# Patient Record
Sex: Female | Born: 1950 | Race: White | Hispanic: No | Smoking: Current some day smoker
Health system: Southern US, Community
[De-identification: ages and names within clinical notes are randomized; demographics above are authoritative.]

## PROBLEM LIST (undated history)

## (undated) DIAGNOSIS — J309 Allergic rhinitis, unspecified: Secondary | ICD-10-CM

## (undated) DIAGNOSIS — K589 Irritable bowel syndrome without diarrhea: Secondary | ICD-10-CM

## (undated) DIAGNOSIS — I7301 Raynaud's syndrome with gangrene: Secondary | ICD-10-CM

## (undated) DIAGNOSIS — M545 Low back pain, unspecified: Secondary | ICD-10-CM

## (undated) DIAGNOSIS — G2581 Restless legs syndrome: Secondary | ICD-10-CM

## (undated) DIAGNOSIS — M546 Pain in thoracic spine: Secondary | ICD-10-CM

## (undated) DIAGNOSIS — G894 Chronic pain syndrome: Secondary | ICD-10-CM

## (undated) DIAGNOSIS — H269 Unspecified cataract: Secondary | ICD-10-CM

## (undated) DIAGNOSIS — M533 Sacrococcygeal disorders, not elsewhere classified: Secondary | ICD-10-CM

## (undated) DIAGNOSIS — G259 Extrapyramidal and movement disorder, unspecified: Secondary | ICD-10-CM

## (undated) DIAGNOSIS — N993 Prolapse of vaginal vault after hysterectomy: Secondary | ICD-10-CM

## (undated) DIAGNOSIS — M6240 Contracture of muscle, unspecified site: Secondary | ICD-10-CM

## (undated) DIAGNOSIS — N952 Postmenopausal atrophic vaginitis: Secondary | ICD-10-CM

## (undated) DIAGNOSIS — R32 Unspecified urinary incontinence: Secondary | ICD-10-CM

## (undated) DIAGNOSIS — E669 Obesity, unspecified: Secondary | ICD-10-CM

## (undated) DIAGNOSIS — M1990 Unspecified osteoarthritis, unspecified site: Secondary | ICD-10-CM

## (undated) DIAGNOSIS — G479 Sleep disorder, unspecified: Secondary | ICD-10-CM

## (undated) DIAGNOSIS — G4730 Sleep apnea, unspecified: Secondary | ICD-10-CM

## (undated) DIAGNOSIS — N8110 Cystocele, unspecified: Secondary | ICD-10-CM

## (undated) DIAGNOSIS — E7800 Pure hypercholesterolemia, unspecified: Secondary | ICD-10-CM

## (undated) DIAGNOSIS — J45909 Unspecified asthma, uncomplicated: Secondary | ICD-10-CM

## (undated) DIAGNOSIS — K649 Unspecified hemorrhoids: Secondary | ICD-10-CM

## (undated) DIAGNOSIS — R251 Tremor, unspecified: Secondary | ICD-10-CM

## (undated) DIAGNOSIS — M19079 Primary osteoarthritis, unspecified ankle and foot: Secondary | ICD-10-CM

---

## 1998-11-22 ENCOUNTER — Encounter: Admission: RE | Admit: 1998-11-22 | Discharge: 1998-11-22 | Payer: Self-pay | Admitting: Infectious Diseases

## 2002-10-31 ENCOUNTER — Encounter: Admission: RE | Admit: 2002-10-31 | Discharge: 2002-10-31 | Payer: Self-pay | Admitting: Orthopedic Surgery

## 2002-10-31 ENCOUNTER — Encounter: Payer: Self-pay | Admitting: Orthopedic Surgery

## 2002-11-24 ENCOUNTER — Encounter: Admission: RE | Admit: 2002-11-24 | Discharge: 2002-11-24 | Payer: Self-pay | Admitting: Orthopedic Surgery

## 2002-11-24 ENCOUNTER — Encounter: Payer: Self-pay | Admitting: Orthopedic Surgery

## 2002-11-27 ENCOUNTER — Encounter: Payer: Self-pay | Admitting: Emergency Medicine

## 2002-11-27 ENCOUNTER — Emergency Department (HOSPITAL_COMMUNITY): Admission: EM | Admit: 2002-11-27 | Discharge: 2002-11-27 | Payer: Self-pay | Admitting: Emergency Medicine

## 2002-11-28 ENCOUNTER — Emergency Department (HOSPITAL_COMMUNITY): Admission: EM | Admit: 2002-11-28 | Discharge: 2002-11-29 | Payer: Self-pay | Admitting: Emergency Medicine

## 2003-12-18 DIAGNOSIS — J4540 Moderate persistent asthma, uncomplicated: Secondary | ICD-10-CM | POA: Diagnosis present

## 2004-04-04 ENCOUNTER — Ambulatory Visit (HOSPITAL_COMMUNITY): Admission: RE | Admit: 2004-04-04 | Discharge: 2004-04-04 | Payer: Self-pay | Admitting: Neurosurgery

## 2011-03-31 DIAGNOSIS — G4700 Insomnia, unspecified: Secondary | ICD-10-CM | POA: Insufficient documentation

## 2011-06-07 DIAGNOSIS — G250 Essential tremor: Secondary | ICD-10-CM | POA: Diagnosis present

## 2014-12-01 DIAGNOSIS — K582 Mixed irritable bowel syndrome: Secondary | ICD-10-CM | POA: Diagnosis present

## 2015-08-04 ENCOUNTER — Observation Stay
Admission: EM | Admit: 2015-08-04 | Discharge: 2015-08-06 | Disposition: A | Discharge status: Home / Self Care | Payer: Medicare Other | Attending: Internal Medicine | Admitting: Internal Medicine

## 2015-08-04 DIAGNOSIS — N952 Postmenopausal atrophic vaginitis: Secondary | ICD-10-CM | POA: Diagnosis not present

## 2015-08-04 DIAGNOSIS — M546 Pain in thoracic spine: Secondary | ICD-10-CM | POA: Insufficient documentation

## 2015-08-04 DIAGNOSIS — Z7982 Long term (current) use of aspirin: Secondary | ICD-10-CM | POA: Insufficient documentation

## 2015-08-04 DIAGNOSIS — I7300 Raynaud's syndrome without gangrene: Secondary | ICD-10-CM | POA: Diagnosis not present

## 2015-08-04 DIAGNOSIS — H269 Unspecified cataract: Secondary | ICD-10-CM | POA: Insufficient documentation

## 2015-08-04 DIAGNOSIS — E860 Dehydration: Secondary | ICD-10-CM | POA: Diagnosis not present

## 2015-08-04 DIAGNOSIS — R748 Abnormal levels of other serum enzymes: Secondary | ICD-10-CM | POA: Diagnosis not present

## 2015-08-04 DIAGNOSIS — M1990 Unspecified osteoarthritis, unspecified site: Secondary | ICD-10-CM | POA: Diagnosis not present

## 2015-08-04 DIAGNOSIS — Z882 Allergy status to sulfonamides status: Secondary | ICD-10-CM | POA: Insufficient documentation

## 2015-08-04 DIAGNOSIS — Z91013 Allergy to seafood: Secondary | ICD-10-CM | POA: Diagnosis not present

## 2015-08-04 DIAGNOSIS — Z880 Allergy status to penicillin: Secondary | ICD-10-CM | POA: Diagnosis not present

## 2015-08-04 DIAGNOSIS — F17200 Nicotine dependence, unspecified, uncomplicated: Secondary | ICD-10-CM | POA: Insufficient documentation

## 2015-08-04 DIAGNOSIS — E669 Obesity, unspecified: Secondary | ICD-10-CM | POA: Diagnosis not present

## 2015-08-04 DIAGNOSIS — F329 Major depressive disorder, single episode, unspecified: Secondary | ICD-10-CM | POA: Diagnosis not present

## 2015-08-04 DIAGNOSIS — R7989 Other specified abnormal findings of blood chemistry: Secondary | ICD-10-CM | POA: Insufficient documentation

## 2015-08-04 DIAGNOSIS — E785 Hyperlipidemia, unspecified: Secondary | ICD-10-CM | POA: Diagnosis not present

## 2015-08-04 DIAGNOSIS — K589 Irritable bowel syndrome without diarrhea: Secondary | ICD-10-CM | POA: Insufficient documentation

## 2015-08-04 DIAGNOSIS — Z7951 Long term (current) use of inhaled steroids: Secondary | ICD-10-CM | POA: Diagnosis not present

## 2015-08-04 DIAGNOSIS — M19079 Primary osteoarthritis, unspecified ankle and foot: Secondary | ICD-10-CM | POA: Diagnosis not present

## 2015-08-04 DIAGNOSIS — F419 Anxiety disorder, unspecified: Secondary | ICD-10-CM | POA: Diagnosis not present

## 2015-08-04 DIAGNOSIS — Z6831 Body mass index (BMI) 31.0-31.9, adult: Secondary | ICD-10-CM | POA: Insufficient documentation

## 2015-08-04 DIAGNOSIS — G8929 Other chronic pain: Secondary | ICD-10-CM | POA: Diagnosis not present

## 2015-08-04 DIAGNOSIS — R001 Bradycardia, unspecified: Secondary | ICD-10-CM | POA: Insufficient documentation

## 2015-08-04 DIAGNOSIS — G4730 Sleep apnea, unspecified: Secondary | ICD-10-CM | POA: Diagnosis not present

## 2015-08-04 DIAGNOSIS — Z91040 Latex allergy status: Secondary | ICD-10-CM | POA: Insufficient documentation

## 2015-08-04 DIAGNOSIS — Z885 Allergy status to narcotic agent status: Secondary | ICD-10-CM | POA: Insufficient documentation

## 2015-08-04 DIAGNOSIS — K219 Gastro-esophageal reflux disease without esophagitis: Secondary | ICD-10-CM | POA: Insufficient documentation

## 2015-08-04 DIAGNOSIS — G259 Extrapyramidal and movement disorder, unspecified: Secondary | ICD-10-CM | POA: Insufficient documentation

## 2015-08-04 DIAGNOSIS — R0789 Other chest pain: Secondary | ICD-10-CM | POA: Diagnosis not present

## 2015-08-04 DIAGNOSIS — K649 Unspecified hemorrhoids: Secondary | ICD-10-CM | POA: Diagnosis not present

## 2015-08-04 DIAGNOSIS — R9439 Abnormal result of other cardiovascular function study: Secondary | ICD-10-CM | POA: Diagnosis not present

## 2015-08-04 DIAGNOSIS — R109 Unspecified abdominal pain: Secondary | ICD-10-CM | POA: Insufficient documentation

## 2015-08-04 DIAGNOSIS — N8110 Cystocele, unspecified: Secondary | ICD-10-CM | POA: Diagnosis not present

## 2015-08-04 DIAGNOSIS — G2581 Restless legs syndrome: Secondary | ICD-10-CM | POA: Insufficient documentation

## 2015-08-04 DIAGNOSIS — R002 Palpitations: Secondary | ICD-10-CM | POA: Diagnosis not present

## 2015-08-04 DIAGNOSIS — Z79899 Other long term (current) drug therapy: Secondary | ICD-10-CM | POA: Insufficient documentation

## 2015-08-04 DIAGNOSIS — Z91030 Bee allergy status: Secondary | ICD-10-CM | POA: Insufficient documentation

## 2015-08-04 DIAGNOSIS — R251 Tremor, unspecified: Secondary | ICD-10-CM | POA: Insufficient documentation

## 2015-08-04 DIAGNOSIS — Z881 Allergy status to other antibiotic agents status: Secondary | ICD-10-CM | POA: Diagnosis not present

## 2015-08-04 DIAGNOSIS — M6240 Contracture of muscle, unspecified site: Secondary | ICD-10-CM | POA: Insufficient documentation

## 2015-08-04 DIAGNOSIS — G894 Chronic pain syndrome: Secondary | ICD-10-CM | POA: Diagnosis not present

## 2015-08-04 DIAGNOSIS — R079 Chest pain, unspecified: Secondary | ICD-10-CM

## 2015-08-04 DIAGNOSIS — E7800 Pure hypercholesterolemia, unspecified: Secondary | ICD-10-CM | POA: Insufficient documentation

## 2015-08-04 DIAGNOSIS — R112 Nausea with vomiting, unspecified: Secondary | ICD-10-CM | POA: Diagnosis not present

## 2015-08-04 DIAGNOSIS — J45909 Unspecified asthma, uncomplicated: Secondary | ICD-10-CM | POA: Insufficient documentation

## 2015-08-04 DIAGNOSIS — J449 Chronic obstructive pulmonary disease, unspecified: Secondary | ICD-10-CM | POA: Insufficient documentation

## 2015-08-04 DIAGNOSIS — Z8249 Family history of ischemic heart disease and other diseases of the circulatory system: Secondary | ICD-10-CM | POA: Diagnosis not present

## 2015-08-04 HISTORY — DX: Restless legs syndrome: G25.81

## 2015-08-04 HISTORY — DX: Unspecified osteoarthritis, unspecified site: M19.90

## 2015-08-04 HISTORY — DX: Contracture of muscle, unspecified site: M62.40

## 2015-08-04 HISTORY — DX: Obesity, unspecified: E66.9

## 2015-08-04 HISTORY — DX: Primary osteoarthritis, unspecified ankle and foot: M19.079

## 2015-08-04 HISTORY — DX: Pain in thoracic spine: M54.6

## 2015-08-04 HISTORY — DX: Unspecified asthma, uncomplicated: J45.909

## 2015-08-04 HISTORY — DX: Unspecified hemorrhoids: K64.9

## 2015-08-04 HISTORY — DX: Irritable bowel syndrome, unspecified: K58.9

## 2015-08-04 HISTORY — DX: Sleep apnea, unspecified: G47.30

## 2015-08-04 HISTORY — DX: Raynaud's syndrome with gangrene: I73.01

## 2015-08-04 HISTORY — DX: Sleep disorder, unspecified: G47.9

## 2015-08-04 HISTORY — DX: Prolapse of vaginal vault after hysterectomy: N99.3

## 2015-08-04 HISTORY — DX: Chronic pain syndrome: G89.4

## 2015-08-04 HISTORY — DX: Low back pain, unspecified: M54.50

## 2015-08-04 HISTORY — DX: Tremor, unspecified: R25.1

## 2015-08-04 HISTORY — DX: Extrapyramidal and movement disorder, unspecified: G25.9

## 2015-08-04 HISTORY — DX: Cystocele, unspecified: N81.10

## 2015-08-04 HISTORY — DX: Pure hypercholesterolemia, unspecified: E78.00

## 2015-08-04 HISTORY — DX: Sacrococcygeal disorders, not elsewhere classified: M53.3

## 2015-08-04 HISTORY — DX: Low back pain: M54.5

## 2015-08-04 HISTORY — DX: Unspecified urinary incontinence: R32

## 2015-08-04 HISTORY — DX: Postmenopausal atrophic vaginitis: N95.2

## 2015-08-04 HISTORY — DX: Unspecified cataract: H26.9

## 2015-08-04 HISTORY — DX: Allergic rhinitis, unspecified: J30.9

## 2015-08-04 NOTE — ED Notes (Signed)
Pt arrives via EMS from home with c/o Left Chest Pain described as being behind left breast and radiating over the shoulder to the back.  Pt with nausea.  Pain is sharp and 9/10.  Pt with these episodes for 2 months and is being followed by [facility] cardiology.  Pt with echo completed on Monday.

## 2015-08-05 ENCOUNTER — Emergency Department: Payer: Medicare Other

## 2015-08-05 ENCOUNTER — Observation Stay: Payer: Medicare Other

## 2015-08-05 ENCOUNTER — Encounter: Payer: Self-pay | Admitting: Emergency Medicine

## 2015-08-05 DIAGNOSIS — R079 Chest pain, unspecified: Secondary | ICD-10-CM | POA: Diagnosis present

## 2015-08-05 LAB — TROPONIN I
Troponin I: <0.03 ng/mL (ref <0.031)
Troponin I: <0.03 ng/mL (ref <0.031)

## 2015-08-05 LAB — CBC WITH DIFFERENTIAL/PLATELET
BASOS ABS: 0.1 10*3/uL (ref 0–0.1)
BASOS PCT: 1 %
EOS ABS: 0.1 10*3/uL (ref 0–0.7)
Eosinophils Relative: 1 %
HEMATOCRIT: 40.6 % (ref 35.0–47.0)
Hemoglobin: 13.5 g/dL (ref 12.0–16.0)
Lymphocytes Relative: 39 %
Lymphs Abs: 3.7 10*3/uL — ABNORMAL HIGH (ref 1.0–3.6)
MCH: 31.8 pg (ref 26.0–34.0)
MCHC: 33.3 g/dL (ref 32.0–36.0)
MCV: 95.5 fL (ref 80.0–100.0)
MONO ABS: 0.8 10*3/uL (ref 0.2–0.9)
MONOS PCT: 9 %
NEUTROS ABS: 4.7 10*3/uL (ref 1.4–6.5)
Neutrophils Relative %: 50 %
Platelets: 208 10*3/uL (ref 150–440)
RBC: 4.25 MIL/uL (ref 3.80–5.20)
RDW: 14 % (ref 11.5–14.5)
WBC: 9.3 10*3/uL (ref 3.6–11.0)

## 2015-08-05 LAB — COMPREHENSIVE METABOLIC PANEL
ALBUMIN: 4 g/dL (ref 3.5–5.0)
ALT: 59 U/L — ABNORMAL HIGH (ref 14–54)
ANION GAP: 8 (ref 5–15)
AST: 114 U/L — AB (ref 15–41)
Alkaline Phosphatase: 66 U/L (ref 38–126)
BUN: 22 mg/dL — AB (ref 6–20)
CHLORIDE: 99 mmol/L — AB (ref 101–111)
CO2: 30 mmol/L (ref 22–32)
Calcium: 9.1 mg/dL (ref 8.9–10.3)
Creatinine, Ser: 1.01 mg/dL — ABNORMAL HIGH (ref 0.44–1.00)
GFR calc Af Amer: >60 mL/min (ref >60)
GFR calc non Af Amer: 59 mL/min — ABNORMAL LOW (ref >60)
GLUCOSE: 99 mg/dL (ref 65–99)
POTASSIUM: 3.5 mmol/L (ref 3.5–5.1)
SODIUM: 137 mmol/L (ref 135–145)
TOTAL PROTEIN: 6.4 g/dL — AB (ref 6.5–8.1)
Total Bilirubin: 0.6 mg/dL (ref 0.3–1.2)

## 2015-08-05 LAB — NM MYOCAR MULTI W/SPECT W/WALL MOTION / EF
CHL CUP RESTING HR STRESS: 50 {beats}/min
CSEPPHR: 86 {beats}/min
LVDIAVOL: 140 mL
LVSYSVOL: 75 mL
NUC STRESS TID: 1.15
SDS: 1
SRS: 1
SSS: 0

## 2015-08-05 LAB — LIPASE, BLOOD: Lipase: 62 U/L — ABNORMAL HIGH (ref 22–51)

## 2015-08-05 LAB — BRAIN NATRIURETIC PEPTIDE: B NATRIURETIC PEPTIDE 5: 54 pg/mL (ref 0.0–100.0)

## 2015-08-05 MED ORDER — REGADENOSON 0.4 MG/5ML IV SOLN
0.4000 mg | Freq: Once | INTRAVENOUS | Status: AC
  Administered 2015-08-05: 0.4 mg via INTRAVENOUS
  Filled 2015-08-05: qty 5

## 2015-08-05 MED ORDER — TIOTROPIUM BROMIDE MONOHYDRATE 18 MCG IN CAPS
18.0000 ug | ORAL_CAPSULE | Freq: Every day | RESPIRATORY_TRACT | Status: DC
  Administered 2015-08-05 – 2015-08-06 (×2): 18 ug via RESPIRATORY_TRACT
  Filled 2015-08-05: qty 5

## 2015-08-05 MED ORDER — LORATADINE 10 MG PO TABS
10.0000 mg | ORAL_TABLET | Freq: Every day | ORAL | Status: DC
  Administered 2015-08-05: 10 mg via ORAL
  Filled 2015-08-05: qty 1

## 2015-08-05 MED ORDER — PNEUMOCOCCAL VAC POLYVALENT 25 MCG/0.5ML IJ INJ: 0.5000 mL | INJECTION | INTRAMUSCULAR | Status: DC

## 2015-08-05 MED ORDER — DARIFENACIN HYDROBROMIDE ER 7.5 MG PO TB24
7.5000 mg | ORAL_TABLET | Freq: Every day | ORAL | Status: DC
  Administered 2015-08-05: 7.5 mg via ORAL
  Filled 2015-08-05: qty 1

## 2015-08-05 MED ORDER — ROPINIROLE HCL 1 MG PO TABS
1.0000 mg | ORAL_TABLET | Freq: Two times a day (BID) | ORAL | Status: DC
Start: 2015-08-05 — End: 2015-08-06
  Administered 2015-08-05 (×2): 1 mg via ORAL
  Filled 2015-08-05 (×2): qty 1

## 2015-08-05 MED ORDER — ACETAMINOPHEN 650 MG RE SUPP: 650.0000 mg | Freq: Four times a day (QID) | RECTAL | Status: DC | PRN

## 2015-08-05 MED ORDER — SODIUM CHLORIDE 0.9 % WEIGHT BASED INFUSION
3.0000 mL/kg/h | INTRAVENOUS | Status: DC
  Administered 2015-08-06: 3 mL/kg/h via INTRAVENOUS

## 2015-08-05 MED ORDER — BACLOFEN 10 MG PO TABS
10.0000 mg | ORAL_TABLET | Freq: Two times a day (BID) | ORAL | Status: DC
  Administered 2015-08-05 (×2): 10 mg via ORAL
  Filled 2015-08-05 (×2): qty 1

## 2015-08-05 MED ORDER — FLUTICASONE PROPIONATE 50 MCG/ACT NA SUSP
1.0000 | Freq: Every day | NASAL | Status: DC
  Administered 2015-08-05: 1 via NASAL
  Filled 2015-08-05: qty 16

## 2015-08-05 MED ORDER — CLONAZEPAM 1 MG PO TABS: 1.0000 mg | ORAL_TABLET | Freq: Three times a day (TID) | ORAL | Status: DC | PRN

## 2015-08-05 MED ORDER — SODIUM CHLORIDE 0.9 % WEIGHT BASED INFUSION: 1.0000 mL/kg/h | INTRAVENOUS | Status: DC

## 2015-08-05 MED ORDER — OLOPATADINE HCL 0.1 % OP SOLN
1.0000 [drp] | Freq: Every day | OPHTHALMIC | Status: DC
  Administered 2015-08-05: 1 [drp] via OPHTHALMIC
  Filled 2015-08-05: qty 5

## 2015-08-05 MED ORDER — HYDROMORPHONE HCL 1 MG/ML IJ SOLN
0.5000 mg | Freq: Once | INTRAMUSCULAR | Status: AC
  Administered 2015-08-05: 0.5 mg via INTRAVENOUS
  Filled 2015-08-05: qty 1

## 2015-08-05 MED ORDER — ASPIRIN 81 MG PO TBEC: 81.0000 mg | DELAYED_RELEASE_TABLET | Freq: Every day | ORAL | Status: DC

## 2015-08-05 MED ORDER — BUSPIRONE HCL 10 MG PO TABS
30.0000 mg | ORAL_TABLET | Freq: Two times a day (BID) | ORAL | Status: DC
  Filled 2015-08-05 (×2): qty 3

## 2015-08-05 MED ORDER — SODIUM CHLORIDE 0.9 % IJ SOLN: 3.0000 mL | INTRAMUSCULAR | Status: DC | PRN

## 2015-08-05 MED ORDER — BUDESONIDE-FORMOTEROL FUMARATE 160-4.5 MCG/ACT IN AERO
2.0000 | INHALATION_SPRAY | Freq: Two times a day (BID) | RESPIRATORY_TRACT | Status: DC
  Administered 2015-08-05 – 2015-08-06 (×3): 2 via RESPIRATORY_TRACT
  Filled 2015-08-05: qty 6

## 2015-08-05 MED ORDER — HYDROCODONE-ACETAMINOPHEN 5-325 MG PO TABS
1.0000 | ORAL_TABLET | Freq: Three times a day (TID) | ORAL | Status: DC | PRN
  Administered 2015-08-05: 1 via ORAL
  Filled 2015-08-05: qty 1

## 2015-08-05 MED ORDER — LIDOCAINE 5 % EX OINT
1.0000 "application " | TOPICAL_OINTMENT | Freq: Two times a day (BID) | CUTANEOUS | Status: DC | PRN
  Administered 2015-08-05: 1 via TOPICAL
  Filled 2015-08-05: qty 35.44

## 2015-08-05 MED ORDER — ONDANSETRON HCL 4 MG PO TABS: 4.0000 mg | ORAL_TABLET | Freq: Four times a day (QID) | ORAL | Status: DC | PRN

## 2015-08-05 MED ORDER — ASPIRIN EC 81 MG PO TBEC
81.0000 mg | DELAYED_RELEASE_TABLET | Freq: Every day | ORAL | Status: DC
  Administered 2015-08-05: 81 mg via ORAL
  Filled 2015-08-05: qty 1

## 2015-08-05 MED ORDER — VITAMIN B-6 100 MG PO TABS
200.0000 mg | ORAL_TABLET | Freq: Every day | ORAL | Status: DC
  Administered 2015-08-05: 200 mg via ORAL
  Filled 2015-08-05 (×2): qty 2

## 2015-08-05 MED ORDER — TECHNETIUM TC 99M SESTAMIBI - CARDIOLITE
14.0650 | Freq: Once | INTRAVENOUS | Status: AC | PRN
  Administered 2015-08-05: 11:00:00 14.065 via INTRAVENOUS

## 2015-08-05 MED ORDER — ACETAMINOPHEN 325 MG PO TABS: 650.0000 mg | ORAL_TABLET | Freq: Four times a day (QID) | ORAL | Status: DC | PRN

## 2015-08-05 MED ORDER — CHOLECALCIFEROL 10 MCG (400 UNIT) PO TABS
400.0000 [IU] | ORAL_TABLET | Freq: Every day | ORAL | Status: DC
  Administered 2015-08-05: 400 [IU] via ORAL
  Filled 2015-08-05: qty 1

## 2015-08-05 MED ORDER — TEMAZEPAM 15 MG PO CAPS
30.0000 mg | ORAL_CAPSULE | Freq: Every day | ORAL | Status: DC
  Administered 2015-08-05: 30 mg via ORAL
  Filled 2015-08-05: qty 2

## 2015-08-05 MED ORDER — SODIUM CHLORIDE 0.9 % IV SOLN: 250.0000 mL | INTRAVENOUS | Status: DC | PRN

## 2015-08-05 MED ORDER — DICYCLOMINE HCL 20 MG PO TABS
20.0000 mg | ORAL_TABLET | Freq: Three times a day (TID) | ORAL | Status: DC
  Administered 2015-08-06: 20 mg via ORAL
  Filled 2015-08-05 (×5): qty 1

## 2015-08-05 MED ORDER — MINOCYCLINE HCL 50 MG PO CAPS
50.0000 mg | ORAL_CAPSULE | Freq: Every day | ORAL | Status: DC
  Administered 2015-08-05: 50 mg via ORAL
  Filled 2015-08-05 (×2): qty 1

## 2015-08-05 MED ORDER — ALBUTEROL SULFATE (2.5 MG/3ML) 0.083% IN NEBU: 3.0000 mL | INHALATION_SOLUTION | Freq: Four times a day (QID) | RESPIRATORY_TRACT | Status: DC | PRN

## 2015-08-05 MED ORDER — OCUVITE-LUTEIN PO CAPS
1.0000 | ORAL_CAPSULE | Freq: Every day | ORAL | Status: DC
  Administered 2015-08-05: 1 via ORAL
  Filled 2015-08-05: qty 1

## 2015-08-05 MED ORDER — REGADENOSON 0.4 MG/5ML IV SOLN
0.4000 mg | Freq: Once | INTRAVENOUS | Status: DC
  Filled 2015-08-05: qty 5

## 2015-08-05 MED ORDER — SODIUM CHLORIDE 0.9 % IJ SOLN: 3.0000 mL | Freq: Two times a day (BID) | INTRAMUSCULAR | Status: DC

## 2015-08-05 MED ORDER — NITROGLYCERIN 0.4 MG SL SUBL: 0.4000 mg | SUBLINGUAL_TABLET | SUBLINGUAL | Status: DC | PRN

## 2015-08-05 MED ORDER — TECHNETIUM TC 99M SESTAMIBI - CARDIOLITE
30.0600 | Freq: Once | INTRAVENOUS | Status: AC | PRN
  Administered 2015-08-05: 13:00:00 30.06 via INTRAVENOUS

## 2015-08-05 MED ORDER — ESTROGENS, CONJUGATED 0.625 MG/GM VA CREA
1.0000 | TOPICAL_CREAM | VAGINAL | Status: DC
  Filled 2015-08-05: qty 30

## 2015-08-05 MED ORDER — PANTOPRAZOLE SODIUM 40 MG PO TBEC
40.0000 mg | DELAYED_RELEASE_TABLET | Freq: Every day | ORAL | Status: DC
  Administered 2015-08-05: 40 mg via ORAL
  Filled 2015-08-05: qty 1

## 2015-08-05 MED ORDER — ENOXAPARIN SODIUM 40 MG/0.4ML ~~LOC~~ SOLN
40.0000 mg | SUBCUTANEOUS | Status: DC
  Administered 2015-08-05: 40 mg via SUBCUTANEOUS
  Filled 2015-08-05: qty 0.4

## 2015-08-05 MED ORDER — ATORVASTATIN CALCIUM 20 MG PO TABS
40.0000 mg | ORAL_TABLET | Freq: Every evening | ORAL | Status: DC
  Administered 2015-08-05: 40 mg via ORAL
  Filled 2015-08-05: qty 2

## 2015-08-05 MED ORDER — ONDANSETRON HCL 4 MG/2ML IJ SOLN
4.0000 mg | Freq: Once | INTRAMUSCULAR | Status: AC
  Administered 2015-08-05: 4 mg via INTRAVENOUS
  Filled 2015-08-05: qty 2

## 2015-08-05 MED ORDER — SULINDAC 200 MG PO TABS
200.0000 mg | ORAL_TABLET | Freq: Every day | ORAL | Status: DC
  Filled 2015-08-05 (×3): qty 1

## 2015-08-05 MED ORDER — FLUOXETINE HCL 20 MG PO CAPS
60.0000 mg | ORAL_CAPSULE | Freq: Every day | ORAL | Status: DC
  Filled 2015-08-05: qty 3

## 2015-08-05 MED ORDER — MONTELUKAST SODIUM 10 MG PO TABS
10.0000 mg | ORAL_TABLET | Freq: Every day | ORAL | Status: DC
  Administered 2015-08-05: 10 mg via ORAL
  Filled 2015-08-05: qty 1

## 2015-08-05 MED ORDER — ONDANSETRON HCL 4 MG/2ML IJ SOLN: 4.0000 mg | Freq: Four times a day (QID) | INTRAMUSCULAR | Status: DC | PRN

## 2015-08-05 MED ORDER — CYCLOSPORINE 0.05 % OP EMUL
1.0000 [drp] | Freq: Two times a day (BID) | OPHTHALMIC | Status: DC
  Administered 2015-08-05 (×2): 1 [drp] via OPHTHALMIC
  Filled 2015-08-05 (×4): qty 1

## 2015-08-05 MED ORDER — SODIUM CHLORIDE 0.9 % IV BOLUS (SEPSIS)
1000.0000 mL | Freq: Once | INTRAVENOUS | Status: AC
  Administered 2015-08-05: 1000 mL via INTRAVENOUS

## 2015-08-05 MED ORDER — TECHNETIUM TC 99M SESTAMIBI - CARDIOLITE: 30.0000 | Freq: Once | INTRAVENOUS | Status: DC | PRN

## 2015-08-05 MED ORDER — ASPIRIN 81 MG PO CHEW
81.0000 mg | CHEWABLE_TABLET | ORAL | Status: AC
  Administered 2015-08-06: 81 mg via ORAL
  Filled 2015-08-05: qty 1

## 2015-08-05 MED ORDER — INFLUENZA VAC SPLIT QUAD 0.5 ML IM SUSY: 0.5000 mL | PREFILLED_SYRINGE | INTRAMUSCULAR | Status: DC

## 2015-08-05 MED ORDER — ALOSETRON HCL 1 MG PO TABS: 1.0000 mg | ORAL_TABLET | Freq: Two times a day (BID) | ORAL | Status: DC

## 2015-08-05 MED ORDER — RAMELTEON 8 MG PO TABS
8.0000 mg | ORAL_TABLET | Freq: Every day | ORAL | Status: DC
  Administered 2015-08-05: 8 mg via ORAL
  Filled 2015-08-05 (×2): qty 1

## 2015-08-05 MED ORDER — TECHNETIUM TC 99M MEBROFENIN IV KIT: 5.0000 | PACK | Freq: Once | INTRAVENOUS | Status: DC | PRN

## 2015-08-05 NOTE — Discharge Summary (Signed)
[place] at [place] NAME: Sonia Robinson    MR#:  [id number]  DATE OF BIRTH:  1951-08-16  DATE OF ADMISSION:  08/04/2015 ADMITTING PHYSICIAN: Fritzi Mandes, MD  DATE OF DISCHARGE: 08/05/2015 PRIMARY CARE PHYSICIAN: PROVIDER NOT IN SYSTEM    ADMISSION DIAGNOSIS:  Chest pain [R07.9] Chest pain, unspecified chest pain type [R07.9]   DISCHARGE DIAGNOSIS:   Atypical chest pain. SECONDARY DIAGNOSIS:   Past Medical History  Diagnosis Date  . Spasmodic colon   . Nasal inflammation due to allergen   . Asthma   . Cataract   . Hemorrhoids   . High cholesterol   . Low back pain   . Sleep disorder   . Osteoarthritis   . Prolapse of vaginal vault after hysterectomy   . Restless leg syndrome   . Sleep apnea   . Loss of bladder control   . Disorder of sacrum   . Movement disorder   . Tremor   . Obesity   . Chronic pain syndrome   . Degenerative joint disease of ankle and foot   . Pain in thoracic spine   . Involuntary muscle contractions   . Irritable bowel syndrome   . 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 Fallen bladder   . Vaginal atrophy     HOSPITAL COURSE:   1. Chest pain on and off several months. Patient reports pain under the left breast bone. It is reproducible on palpation. Likely musculoskeletal. EKG does not show any ST-T changes. Cardiac enzyme negative. She is on aspirin and when necessary nitroglycerin. She will be discharged if stress test is normal.  2. Hyperlipidemia continue lipitor.  3. Bradycardia as noted on EKG. Patient asymptomatic   * Tobacco abuse. She restarted smoking 3 months ago, 1 pack a day. Smoking cessation was counseled for 3-5 min.  DISCHARGE CONDITIONS:   Stable, discharge to home today.  CONSULTS OBTAINED:     DRUG ALLERGIES:   Allergies  Allergen Reactions  . Bee Venom Anaphylaxis  . Betadine [Povidone Iodine] Shortness Of Breath  . Morphine And Related Anaphylaxis   . Codeine Swelling  . Depakote [Divalproex Sodium] Hives  . Naproxen Swelling  . Augmentin [Amoxicillin-Pot Clavulanate] Rash  . Latex Rash  . Penicillins Rash  . Shellfish Allergy Nausea And Vomiting, Swelling and Rash  . Sulfa Antibiotics Itching    DISCHARGE MEDICATIONS:   Current Discharge Medication List    START taking these medications   Details  aspirin EC 81 MG EC tablet Take 1 tablet (81 mg total) by mouth daily. Qty: 30 tablet, Refills: 0      CONTINUE these medications which have NOT CHANGED   Details  albuterol (PROVENTIL HFA;VENTOLIN HFA) 108 (90 BASE) MCG/ACT inhaler Inhale 2 puffs into the lungs every 6 (six) hours as needed for wheezing or shortness of breath.    alosetron (LOTRONEX) 1 MG tablet Take 1 mg by mouth 2 (two) times daily.    atorvastatin (LIPITOR) 40 MG tablet Take 40 mg by mouth every evening.    budesonide-formoterol (SYMBICORT) 160-4.5 MCG/ACT inhaler Inhale 2 puffs into the lungs 2 (two) times daily.    busPIRone (BUSPAR) 15 MG tablet Take 30 mg by mouth 2 (two) times daily.    cetirizine (ZYRTEC) 10 MG tablet Take 10 mg by mouth daily.    cholecalciferol (VITAMIN D) 400 UNITS TABS tablet Take 400 Units by mouth daily.    clonazePAM (KLONOPIN) 1 MG tablet  Take 1 mg by mouth 3 (three) times daily as needed for anxiety.    conjugated estrogens (PREMARIN) vaginal cream Place 1 Applicatorful vaginally 2 (two) times a week.    cycloSPORINE (RESTASIS) 0.05 % ophthalmic emulsion Place 1 drop into both eyes 2 (two) times daily.    diclofenac sodium (VOLTAREN) 1 % GEL Apply 2 g topically 4 (four) times daily.    dicyclomine (BENTYL) 20 MG tablet Take 20 mg by mouth every 6 (six) hours.    diphenoxylate-atropine (LOMOTIL) 2.5-0.025 MG tablet Take 1 tablet by mouth 3 (three) times daily as needed for diarrhea or loose stools.    EPINEPHrine (EPIPEN 2-PAK) 0.3 mg/0.3 mL IJ SOAJ injection Inject 0.3 mg into the muscle once.    esomeprazole  (NEXIUM) 40 MG capsule Take 40 mg by mouth daily before breakfast.    FLUoxetine (PROZAC) 20 MG capsule Take 60 mg by mouth daily.    fluticasone (FLONASE) 50 MCG/ACT nasal spray Place 1 spray into both nostrils daily.    HYDROcodone-acetaminophen (NORCO/VICODIN) 5-325 MG tablet Take 1 tablet by mouth every 8 (eight) hours as needed for moderate pain.    hydrocortisone (PROCTOSOL HC) 2.5 % rectal cream Place 1 application rectally daily as needed for hemorrhoids or itching.    lidocaine (XYLOCAINE) 5 % ointment Apply 1 application topically 2 (two) times daily as needed for mild pain.    minocycline (MINOCIN,DYNACIN) 50 MG capsule Take 50 mg by mouth daily.    montelukast (SINGULAIR) 10 MG tablet Take 10 mg by mouth at bedtime.    multivitamin-lutein (OCUVITE-LUTEIN) CAPS capsule Take 1 capsule by mouth daily.    olopatadine (PATANOL) 0.1 % ophthalmic solution Place 1 drop into both eyes daily.    pyridOXINE (VITAMIN B-6) 100 MG tablet Take 200 mg by mouth daily.    ramelteon (ROZEREM) 8 MG tablet Take 8 mg by mouth at bedtime.    rOPINIRole (REQUIP) 1 MG tablet Take 1 mg by mouth 2 (two) times daily. One tablet at 1800 and one tablet at bedtime    solifenacin (VESICARE) 5 MG tablet Take 5 mg by mouth daily.    sulindac (CLINORIL) 200 MG tablet Take 200 mg by mouth daily.    temazepam (RESTORIL) 30 MG capsule Take 30 mg by mouth at bedtime.    tiotropium (SPIRIVA) 18 MCG inhalation capsule Place 18 mcg into inhaler and inhale daily.    urea (CARMOL) 40 % ointment Apply 1 application topically every evening.      STOP taking these medications     baclofen (LIORESAL) 10 MG tablet          DISCHARGE INSTRUCTIONS:    If you experience worsening of your admission symptoms, develop shortness of breath, life threatening emergency, suicidal or homicidal thoughts you must seek medical attention immediately by calling 911 or calling your MD immediately  if symptoms less  severe.  You Must read complete instructions/literature along with all the possible adverse reactions/side effects for all the Medicines you take and that have been prescribed to you. Take any new Medicines after you have completely understood and accept all the possible adverse reactions/side effects.   Please note  You were cared for by a hospitalist during your hospital stay. If you have any questions about your discharge medications or the care you received while you were in the hospital after you are discharged, you can call the unit and asked to speak with the hospitalist on call if the hospitalist that took care  of you is not available. Once you are discharged, your primary care physician will handle any further medical issues. Please note that NO REFILLS for any discharge medications will be authorized once you are discharged, as it is imperative that you return to your primary care physician (or establish a relationship with a primary care physician if you do not have one) for your aftercare needs so that they can reassess your need for medications and monitor your lab values.    Today   SUBJECTIVE   Left side chest pain under breast.   VITAL SIGNS:  Blood pressure 118/61, pulse 54, temperature 98 F (36.7 C), temperature source Oral, resp. rate 18, height 5\' 7"  (1.702 m), weight 87.998 kg (194 lb), SpO2 100 %.  I/O:   Intake/Output Summary (Last 24 hours) at 08/05/15 1618 Last data filed at 08/05/15 1413  Gross per 24 hour  Intake      0 ml  Output      0 ml  Net      0 ml    PHYSICAL EXAMINATION:  GENERAL:  64 y.o.-year-old patient lying in the bed with no acute distress.  EYES: Pupils equal, round, reactive to light and accommodation. No scleral icterus. Extraocular muscles intact.  HEENT: Head atraumatic, normocephalic. Oropharynx and nasopharynx clear.  NECK:  Supple, no jugular venous distention. No thyroid enlargement, no tenderness.  LUNGS: Normal breath sounds  bilaterally, no wheezing, rales,rhonchi or crepitation. No use of accessory muscles of respiration.  Tenderness on palpation on ribs under left breast. CARDIOVASCULAR: S1, S2 normal. No murmurs, rubs, or gallops.  ABDOMEN: Soft, non-tender, non-distended. Bowel sounds present. No organomegaly or mass.  EXTREMITIES: No pedal edema, cyanosis, or clubbing.  NEUROLOGIC: Cranial nerves II through XII are intact. Muscle strength 5/5 in all extremities. Sensation intact. Gait not checked.  PSYCHIATRIC: The patient is alert and oriented x 3.  SKIN: No obvious rash, lesion, or ulcer.   DATA REVIEW:   CBC  Recent Labs Lab 08/04/15 2336  WBC 9.3  HGB 13.5  HCT 40.6  PLT 208    Chemistries   Recent Labs Lab 08/04/15 2336  NA 137  K 3.5  CL 99*  CO2 30  GLUCOSE 99  BUN 22*  CREATININE 1.01*  CALCIUM 9.1  AST 114*  ALT 59*  ALKPHOS 66  BILITOT 0.6    Cardiac Enzymes  Recent Labs Lab 08/05/15 [place] <0.03    Microbiology Results  No results found for this or any previous visit.  RADIOLOGY:  Dg Chest 2 View  08/05/2015  CLINICAL DATA:  64 year old female with left chest pain radiating to the shoulder and back with nausea. Initial encounter. EXAM: CHEST  2 VIEW COMPARISON:  None. FINDINGS: Normal lung volumes. Normal cardiac size. Other mediastinal contours are within normal limits. Visualized tracheal air column is within normal limits. No pneumothorax, pulmonary edema, pleural effusion or confluent pulmonary opacity. Cholecystectomy clips. No acute osseous abnormality identified. IMPRESSION: Negative, no acute cardiopulmonary abnormality. Electronically Signed   By: Genevie Ann M.D.   On: 08/05/2015 00:49   US Abdomen Limited Ruq  08/05/2015  CLINICAL DATA:  Elevated liver function studies and lipase. Post cholecystectomy. Abdominal pain with nausea and vomiting for 2 months. EXAM: US ABDOMEN LIMITED - RIGHT UPPER QUADRANT COMPARISON:  None. FINDINGS: Gallbladder:  Gallbladder is surgically absent. Common bile duct: Diameter: 6.6 mm, normal Liver: No focal lesion identified. Within normal limits in parenchymal echogenicity. IMPRESSION: Surgical absence of the gallbladder. No  bile duct dilatation. Normal ultrasound appearance of the liver. Electronically Signed   By: Lucienne Capers M.D.   On: 08/05/2015 02:28        Management plans discussed with the patient, family and they are in agreement.  CODE STATUS:     Code Status Orders        Start     Ordered   08/05/15 0415  Full code   Continuous     08/05/15 0414    Advance Directive Documentation        Most Recent Value   Type of Advance Directive  Healthcare Power of Attorney, Living will   Pre-existing out of facility DNR order (yellow form or pink MOST form)     "MOST" Form in Place?        TOTAL TIME TAKING CARE OF THIS PATIENT: 36 minutes.    Demetrios Loll M.D on 08/05/2015 at 4:18 PM  Between 7am to 6pm - Pager - [phone]  After 6pm go to [email or web address] - password [email or web address] [facility]  Office  [phone]  CC: Primary care physician; PROVIDER NOT IN SYSTEM

## 2015-08-05 NOTE — Discharge Instructions (Signed)
Heart healthy diet. °Activity as tolerated. °Smoking cessation. °

## 2015-08-05 NOTE — Progress Notes (Signed)
Sonia Robinson at [place] NAME: Sonia Robinson    MR#:  [id number]  DATE OF BIRTH:  1951-08-19  SUBJECTIVE:  CHIEF COMPLAINT:   Chief Complaint  Patient presents with  . Chest Pain  still left side chest pain.  REVIEW OF SYSTEMS:  CONSTITUTIONAL: No fever, fatigue or weakness.  EYES: No blurred or double vision.  EARS, NOSE, AND THROAT: No tinnitus or ear pain.  RESPIRATORY: No cough, shortness of breath, wheezing or hemoptysis.  CARDIOVASCULAR: Has chest pain, no orthopnea, edema.  GASTROINTESTINAL: No nausea, vomiting, diarrhea or abdominal pain.  GENITOURINARY: No dysuria, hematuria.  ENDOCRINE: No polyuria, nocturia,  HEMATOLOGY: No anemia, easy bruising or bleeding SKIN: No rash or lesion. MUSCULOSKELETAL: No joint pain or arthritis.   NEUROLOGIC: No tingling, numbness, weakness.  PSYCHIATRY: No anxiety or depression.   DRUG ALLERGIES:   Allergies  Allergen Reactions  . Bee Venom Anaphylaxis  . Betadine [Povidone Iodine] Shortness Of Breath  . Morphine And Related Anaphylaxis  . Codeine Swelling  . Depakote [Divalproex Sodium] Hives  . Naproxen Swelling  . Augmentin [Amoxicillin-Pot Clavulanate] Rash  . Latex Rash  . Penicillins Rash  . Shellfish Allergy Nausea And Vomiting, Swelling and Rash  . Sulfa Antibiotics Itching    VITALS:  Blood pressure 118/61, pulse 54, temperature 98 F (36.7 C), temperature source Oral, resp. rate 18, height 5\' 7"  (1.702 m), weight 87.998 kg (194 lb), SpO2 100 %.  PHYSICAL EXAMINATION:  GENERAL:  64 y.o.-year-old patient lying in the bed with no acute distress.  EYES: Pupils equal, round, reactive to light and accommodation. No scleral icterus. Extraocular muscles intact.  HEENT: Head atraumatic, normocephalic. Oropharynx and nasopharynx clear.  NECK:  Supple, no jugular venous distention. No thyroid enlargement, no tenderness.  LUNGS: Normal breath sounds bilaterally, no wheezing,  rales,rhonchi or crepitation. No use of accessory muscles of respiration. Tenderness on left side chest wall under breast. CARDIOVASCULAR: S1, S2 normal. No murmurs, rubs, or gallops.  ABDOMEN: Soft, nontender, nondistended. Bowel sounds present. No organomegaly or mass.  EXTREMITIES: No pedal edema, cyanosis, or clubbing.  NEUROLOGIC: Cranial nerves II through XII are intact. Muscle strength 5/5 in all extremities. Sensation intact. Gait not checked.  PSYCHIATRIC: The patient is alert and oriented x 3.  SKIN: No obvious rash, lesion, or ulcer.    LABORATORY PANEL:   CBC  Recent Labs Lab 08/04/15 2336  WBC 9.3  HGB 13.5  HCT 40.6  PLT 208   ------------------------------------------------------------------------------------------------------------------  Chemistries   Recent Labs Lab 08/04/15 2336  NA 137  K 3.5  CL 99*  CO2 30  GLUCOSE 99  BUN 22*  CREATININE 1.01*  CALCIUM 9.1  AST 114*  ALT 59*  ALKPHOS 66  BILITOT 0.6   ------------------------------------------------------------------------------------------------------------------  Cardiac Enzymes  Recent Labs Lab 08/05/15 1428  TROPONINI <0.03   ------------------------------------------------------------------------------------------------------------------  RADIOLOGY:  Dg Chest 2 View  08/05/2015  CLINICAL DATA:  64 year old female with left chest pain radiating to the shoulder and back with nausea. Initial encounter. EXAM: CHEST  2 VIEW COMPARISON:  None. FINDINGS: Normal lung volumes. Normal cardiac size. Other mediastinal contours are within normal limits. Visualized tracheal air column is within normal limits. No pneumothorax, pulmonary edema, pleural effusion or confluent pulmonary opacity. Cholecystectomy clips. No acute osseous abnormality identified. IMPRESSION: Negative, no acute cardiopulmonary abnormality. Electronically Signed   By: Sonia Robinson M.D.   On: 08/05/2015 00:49   Nm Myocar Multi  W/spect W/wall Motion /  Ef  08/05/2015   There was no ST segment deviation noted during stress.  Blood pressure demonstrated a blunted response to exercise.  No T wave inversion was noted during stress.  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  The left ventricular ejection fraction is moderately decreased (30-44%).  Nuclear stress EF: 33%.    US Abdomen Limited Ruq  08/05/2015  CLINICAL DATA:  Elevated liver function studies and lipase. Post cholecystectomy. Abdominal pain with nausea and vomiting for 2 months. EXAM: US ABDOMEN LIMITED - RIGHT UPPER QUADRANT COMPARISON:  None. FINDINGS: Gallbladder: Gallbladder is surgically absent. Common bile duct: Diameter: 6.6 mm, normal Liver: No focal lesion identified. Within normal limits in parenchymal echogenicity. IMPRESSION: Surgical absence of the gallbladder. No bile duct dilatation. Normal ultrasound appearance of the liver. Electronically Signed   By: Lucienne Capers M.D.   On: 08/05/2015 02:28    EKG:   Orders placed or performed during the hospital encounter of 08/04/15  . EKG 12-Lead  . EKG 12-Lead    ASSESSMENT AND PLAN:   1. Chest pain on and off several months. Patient reports pain under the left breast bone. It is reproducible on palpation. Likely musculoskeletal. EKG does not show any ST-T changes. Cardiac enzyme negative. She is on aspirin and when necessary nitroglycerin. Per Dr. Clayborn Bigness, stress test is abnormal. The left ventricular ejection fraction is moderately decreased (30-44%). If patient still has chest pain, will get cardiac cath tomorrow. I discussed with Dr. Clayborn Bigness, patient and her daughter. The patient still has chest pain, she wants to get cath tomorrow.   2. Hyperlipidemia continue lipitor.  3. Bradycardia as noted on EKG. Patient asymptomatic   * Tobacco abuse. She restarted smoking 3 months ago, 1 pack a day. Smoking cessation was counseled for 3-5 min.  * Abnormal LFT. F/u  CMP. * Mild dehydration.   I discussed with Dr. Clayborn Bigness. All the records are reviewed and case discussed with Care Management/Social Workerr. Management plans discussed with the patient, her daughter and they are in agreement.  CODE STATUS: Full code.  TOTAL TIME TAKING CARE OF THIS PATIENT: 56 minutes.   POSSIBLE D/C IN 1-2 DAYS, DEPENDING ON CLINICAL CONDITION.   Demetrios Loll M.D on 08/05/2015 at 5:19 PM  Between 7am to 6pm - Pager - [phone]  After 6pm go to [email or web address] - password [email or web address]  Office  [phone]  CC: Primary care physician; PROVIDER NOT IN SYSTEM

## 2015-08-05 NOTE — H&P (Signed)
[facility] [place] NAME: Sonia Robinson    MR#:  [id number]  DATE OF BIRTH:  [birth year]  DATE OF ADMISSION:  08/04/2015  PRIMARY CARE PHYSICIAN: [facility]  REQUESTING/REFERRING PHYSICIAN: dr sung  CHIEF COMPLAINT:  Left-sided chest pain for last couple months  HISTORY OF PRESENT ILLNESS:  Sonia Robinson  is a 64 y.o. female with a known history of hyperlipidemia osteoarthritis, sleep apnea, DJD, comes to the emergency room with on and off complaints of left-sided chest pain which she reports radiates to her left side of the neck. Patient was seen at [facility] cardiology had done an echocardiogram and was told it looks okay. No further evaluation was recommended. She has had a normal stress test about 10 years ago.  Patient came to the emergency room after she had significant chest pain which patient reports under the breast bone. First set of cardiac enzyme is negative. Ultrasound of the abdomen is negative as well. Patient has reproducible chest pain on palpation during my evaluation.  PAST MEDICAL HISTORY:   Past Medical History  Diagnosis Date  . Spasmodic colon   . Nasal inflammation due to allergen   . Asthma   . Cataract   . Hemorrhoids   . High cholesterol   . Low back pain   . Sleep disorder   . Osteoarthritis   . Prolapse of vaginal vault after hysterectomy   . Restless leg syndrome   . Sleep apnea   . Loss of bladder control   . Disorder of sacrum   . Movement disorder   . Tremor   . Obesity   . Chronic pain syndrome   . Degenerative joint disease of ankle and foot   . Pain in thoracic spine   . Involuntary muscle contractions   . Irritable bowel syndrome   . 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 Fallen bladder   . Vaginal atrophy     PAST SURGICAL HISTOIRY:  No past surgical history on file.  SOCIAL HISTORY:   Social History  Substance Use Topics  . Smoking status: Not on file  .  Smokeless tobacco: Not on file  . Alcohol Use: Not on file    FAMILY HISTORY:  No family history on file.  DRUG ALLERGIES:   Allergies  Allergen Reactions  . Bee Venom Anaphylaxis  . Betadine [Povidone Iodine] Shortness Of Breath  . Morphine And Related Anaphylaxis  . Codeine Swelling  . Depakote [Divalproex Sodium] Hives  . Naproxen Swelling  . Augmentin [Amoxicillin-Pot Clavulanate] Rash  . Latex Rash  . Penicillins Rash  . Shellfish Allergy Nausea And Vomiting, Swelling and Rash  . Sulfa Antibiotics Itching    REVIEW OF SYSTEMS:  Review of Systems  Constitutional: Negative for fever, chills and weight loss.  HENT: Negative for ear discharge, ear pain and nosebleeds.   Eyes: Negative for blurred vision, pain and discharge.  Respiratory: Negative for sputum production, shortness of breath, wheezing and stridor.   Cardiovascular: Positive for chest pain. Negative for palpitations, orthopnea and PND.  Gastrointestinal: Negative for nausea, vomiting, abdominal pain and diarrhea.  Genitourinary: Negative for urgency and frequency.  Musculoskeletal: Positive for back pain and joint pain.  Neurological: Negative for sensory change, speech change, focal weakness and weakness.  Psychiatric/Behavioral: Negative for depression and hallucinations. The patient is not nervous/anxious.   All other systems reviewed and are negative.    MEDICATIONS AT HOME:   Prior to Admission  medications   Medication Sig Start Date End Date Taking? Authorizing Provider  albuterol (PROVENTIL HFA;VENTOLIN HFA) 108 (90 BASE) MCG/ACT inhaler Inhale 2 puffs into the lungs every 6 (six) hours as needed for wheezing or shortness of breath.   Yes Historical Provider, MD  alosetron (LOTRONEX) 1 MG tablet Take 1 mg by mouth 2 (two) times daily.   Yes Historical Provider, MD  atorvastatin (LIPITOR) 40 MG tablet Take 40 mg by mouth every evening.   Yes Historical Provider, MD  baclofen (LIORESAL) 10 MG tablet  Take 10 mg by mouth 2 (two) times daily.   Yes Historical Provider, MD  budesonide-formoterol (SYMBICORT) 160-4.5 MCG/ACT inhaler Inhale 2 puffs into the lungs 2 (two) times daily.   Yes Historical Provider, MD  busPIRone (BUSPAR) 15 MG tablet Take 30 mg by mouth 2 (two) times daily.   Yes Historical Provider, MD  cetirizine (ZYRTEC) 10 MG tablet Take 10 mg by mouth daily.   Yes Historical Provider, MD  cholecalciferol (VITAMIN D) 400 UNITS TABS tablet Take 400 Units by mouth daily.   Yes Historical Provider, MD  clonazePAM (KLONOPIN) 1 MG tablet Take 1 mg by mouth 3 (three) times daily as needed for anxiety.   Yes Historical Provider, MD  conjugated estrogens (PREMARIN) vaginal cream Place 1 Applicatorful vaginally 2 (two) times a week.   Yes Historical Provider, MD  cycloSPORINE (RESTASIS) 0.05 % ophthalmic emulsion Place 1 drop into both eyes 2 (two) times daily.   Yes Historical Provider, MD  diclofenac sodium (VOLTAREN) 1 % GEL Apply 2 g topically 4 (four) times daily.   Yes Historical Provider, MD  dicyclomine (BENTYL) 20 MG tablet Take 20 mg by mouth every 6 (six) hours.   Yes Historical Provider, MD  diphenoxylate-atropine (LOMOTIL) 2.5-0.025 MG tablet Take 1 tablet by mouth 3 (three) times daily as needed for diarrhea or loose stools.   Yes Historical Provider, MD  EPINEPHrine (EPIPEN 2-PAK) 0.3 mg/0.3 mL IJ SOAJ injection Inject 0.3 mg into the muscle once.   Yes Historical Provider, MD  esomeprazole (NEXIUM) 40 MG capsule Take 40 mg by mouth daily before breakfast.   Yes Historical Provider, MD  FLUoxetine (PROZAC) 20 MG capsule Take 60 mg by mouth daily.   Yes Historical Provider, MD  fluticasone (FLONASE) 50 MCG/ACT nasal spray Place 1 spray into both nostrils daily.   Yes Historical Provider, MD  HYDROcodone-acetaminophen (NORCO/VICODIN) 5-325 MG tablet Take 1 tablet by mouth every 8 (eight) hours as needed for moderate pain.   Yes Historical Provider, MD  hydrocortisone (PROCTOSOL HC)  2.5 % rectal cream Place 1 application rectally daily as needed for hemorrhoids or itching.   Yes Historical Provider, MD  lidocaine (XYLOCAINE) 5 % ointment Apply 1 application topically 2 (two) times daily as needed for mild pain.   Yes Historical Provider, MD  minocycline (MINOCIN,DYNACIN) 50 MG capsule Take 50 mg by mouth daily.   Yes Historical Provider, MD  montelukast (SINGULAIR) 10 MG tablet Take 10 mg by mouth at bedtime.   Yes Historical Provider, MD  multivitamin-lutein (OCUVITE-LUTEIN) CAPS capsule Take 1 capsule by mouth daily.   Yes Historical Provider, MD  olopatadine (PATANOL) 0.1 % ophthalmic solution Place 1 drop into both eyes daily.   Yes Historical Provider, MD  pyridOXINE (VITAMIN B-6) 100 MG tablet Take 200 mg by mouth daily.   Yes Historical Provider, MD  ramelteon (ROZEREM) 8 MG tablet Take 8 mg by mouth at bedtime.   Yes Historical Provider, MD  rOPINIRole (  REQUIP) 1 MG tablet Take 1 mg by mouth 2 (two) times daily. One tablet at 1800 and one tablet at bedtime   Yes Historical Provider, MD  solifenacin (VESICARE) 5 MG tablet Take 5 mg by mouth daily.   Yes Historical Provider, MD  sulindac (CLINORIL) 200 MG tablet Take 200 mg by mouth daily.   Yes Historical Provider, MD  temazepam (RESTORIL) 30 MG capsule Take 30 mg by mouth at bedtime.   Yes Historical Provider, MD  tiotropium (SPIRIVA) 18 MCG inhalation capsule Place 18 mcg into inhaler and inhale daily.   Yes Historical Provider, MD  urea (CARMOL) 40 % ointment Apply 1 application topically every evening.   Yes Historical Provider, MD      VITAL SIGNS:  Blood pressure 111/63, pulse 52, temperature 97.4 F (36.3 C), temperature source Oral, resp. rate 11, weight 87.091 kg (192 lb), SpO2 98 %.  PHYSICAL EXAMINATION:  GENERAL:  64 y.o.-year-old patient lying in the bed with no acute distress.  EYES: Pupils equal, round, reactive to light and accommodation. No scleral icterus. Extraocular muscles intact.  HEENT: Head  atraumatic, normocephalic. Oropharynx and nasopharynx clear.  NECK:  Supple, no jugular venous distention. No thyroid enlargement, no tenderness.  LUNGS: Normal breath sounds bilaterally, no wheezing, rales,rhonchi or crepitation. No use of accessory muscles of respiration.  CARDIOVASCULAR: S1, S2 normal. No murmurs, rubs, or gallops. Palpable chest pain anteriorly on the left. ABDOMEN: Soft, nontender, nondistended. Bowel sounds present. No organomegaly or mass.  EXTREMITIES: No pedal edema, cyanosis, or clubbing.  NEUROLOGIC: Cranial nerves II through XII are intact. Muscle strength 5/5 in all extremities. Sensation intact. Gait not checked.  PSYCHIATRIC: The patient is alert and oriented x 3.  SKIN: No obvious rash, lesion, or ulcer.   LABORATORY PANEL:   CBC  Recent Labs Lab 08/04/15 2336  WBC 9.3  HGB 13.5  HCT 40.6  PLT 208   ------------------------------------------------------------------------------------------------------------------  Chemistries   Recent Labs Lab 08/04/15 2336  NA 137  K 3.5  CL 99*  CO2 30  GLUCOSE 99  BUN 22*  CREATININE 1.01*  CALCIUM 9.1  AST 114*  ALT 59*  ALKPHOS 66  BILITOT 0.6   ------------------------------------------------------------------------------------------------------------------  Cardiac Enzymes  Recent Labs Lab 08/04/15 2336  TROPONINI <0.03   ------------------------------------------------------------------------------------------------------------------  RADIOLOGY:  Dg Chest 2 View  08/05/2015  CLINICAL DATA:  64 year old female with left chest pain radiating to the shoulder and back with nausea. Initial encounter. EXAM: CHEST  2 VIEW COMPARISON:  None. FINDINGS: Normal lung volumes. Normal cardiac size. Other mediastinal contours are within normal limits. Visualized tracheal air column is within normal limits. No pneumothorax, pulmonary edema, pleural effusion or confluent pulmonary opacity. Cholecystectomy  clips. No acute osseous abnormality identified. IMPRESSION: Negative, no acute cardiopulmonary abnormality. Electronically Signed   By: Genevie Ann M.D.   On: 08/05/2015 00:49   US Abdomen Limited Ruq  08/05/2015  CLINICAL DATA:  Elevated liver function studies and lipase. Post cholecystectomy. Abdominal pain with nausea and vomiting for 2 months. EXAM: US ABDOMEN LIMITED - RIGHT UPPER QUADRANT COMPARISON:  None. FINDINGS: Gallbladder: Gallbladder is surgically absent. Common bile duct: Diameter: 6.6 mm, normal Liver: No focal lesion identified. Within normal limits in parenchymal echogenicity. IMPRESSION: Surgical absence of the gallbladder. No bile duct dilatation. Normal ultrasound appearance of the liver. Electronically Signed   By: Lucienne Capers M.D.   On: 08/05/2015 02:28    EKG:   S bradycardia IMPRESSION AND PLAN:   65 year old Mrs. Grandville Silos  with multiple medical problems comes to the emergency room with  1. Chest pain on and off several months. Patient reports pain under the left breast bone. It appears to be reproducible on palpation. Likely musculoskeletal. EKG does not show any ST-T changes. First set of cardiac enzyme negative. We'll admit for overnight observation. Patient will be kept nothing by mouth and get a Myoview stress test this morning. Continue aspirin when necessary nitroglycerin. Consider cardiac consultation if needed Troponin 2  2. Hyperlipidemia continue statins  3. Bradycardia as noted on EKG. Patient asymptomatic   4. DVT prophylaxis subcutaneous Lovenox   All the records are reviewed and case discussed with ED provider. Management plans discussed with the patient and daughter and they are in agreement.  CODE STATUS: Full  TOTAL TIME TAKING CARE OF THIS PATIENT: 45* minutes.    Adit Riddles M.D on 08/05/2015 at 3:42 AM  Between 7am to 6pm - Pager - [phone]  After 6pm go to [email or web address] - password [email or web address] [facility]   Office  [phone]  CC: Primary care physician; PROVIDER NOT IN SYSTEM

## 2015-08-05 NOTE — Progress Notes (Signed)
Patient requesting IBS medication that she takes at home, Dycyclomine. MD notified and order obtained to continue patient's home dose

## 2015-08-05 NOTE — ED Provider Notes (Signed)
[facility] Emergency Department Provider Note  ____________________________________________  Time seen: Approximately 12:32 AM  I have reviewed the triage vital signs and the nursing notes.   HISTORY  Chief Complaint Chest Pain    HPI Sonia Robinson is a 64 y.o. female who presents to the ED from home via EMS with a chief complaint of chest pain. Patient complains of left sided chest aching radiating to her left shoulder and back. Symptoms associated with diaphoresis, nausea, shortness of breath. Over the past 2 months, patient has had intermittent sharp chest pain which is being evaluated by [facility] cardiology. Patient recently had a normal echocardiogram 3 days ago. She was told the next step in her evaluation would be a stress test. Patient awoke from sleep tonight with severe chest pain associated with 2 episodes of nausea and vomiting. Nothing makes her pain better or worse.   Past Medical History  Diagnosis Date  . Spasmodic colon   . Nasal inflammation due to allergen   . Asthma   . Cataract   . Hemorrhoids   . High cholesterol   . Low back pain   . Sleep disorder   . Osteoarthritis   . Prolapse of vaginal vault after hysterectomy   . Restless leg syndrome   . Sleep apnea   . Loss of bladder control   . Disorder of sacrum   . Movement disorder   . Tremor   . Obesity   . Chronic pain syndrome   . Degenerative joint disease of ankle and foot   . Pain in thoracic spine   . Involuntary muscle contractions   . Irritable bowel syndrome   . 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 Fallen bladder   . Vaginal atrophy     Patient Active Problem List   Diagnosis Date Noted  . Chest pain 08/05/2015    Past surgical history Cholecystectomy   No current outpatient prescriptions on file.  Allergies Bee venom; Betadine; Morphine and related; Codeine; Depakote; Naproxen; Augmentin; Latex; Penicillins; Shellfish allergy; and Sulfa  antibiotics  Family history CAD "Pancreas problems"   Social History Social History  Substance Use Topics  . Smoking status: Current Some Day Smoker -- 0.25 packs/day    Types: Cigarettes  . Smokeless tobacco: Never Used  . Alcohol Use: 2.4 oz/week    4 Glasses of wine per week  nonsmoker  Review of Systems Constitutional: No fever/chills Eyes: No visual changes. ENT: No sore throat. Cardiovascular: Positive for chest pain. Respiratory: Positive for shortness of breath. Gastrointestinal: No abdominal pain.  Positive for nausea and vomiting.  No diarrhea.  No constipation. Genitourinary: Negative for dysuria. Musculoskeletal: Negative for back pain. Skin: Negative for rash. Neurological: Negative for headaches, focal weakness or numbness.  10-point ROS otherwise negative.  ____________________________________________   PHYSICAL EXAM:  VITAL SIGNS: ED Triage Vitals  Enc Vitals Group     BP 08/04/15 2328 112/51 mmHg     Pulse Rate 08/04/15 2328 47     Resp 08/04/15 2328 20     Temp 08/04/15 2328 97.4 F (36.3 C)     Temp Source 08/04/15 2328 Oral     SpO2 08/04/15 2328 100 %     Weight 08/04/15 2328 192 lb (87.091 kg)     Height --      Head Cir --      Peak Flow --      Pain Score --      Pain Loc --  Pain Edu? --      Excl. in [place]? --     Constitutional: Alert and oriented. Well appearing and in mild acute distress. Eyes: Conjunctivae are normal. PERRL. EOMI. Head: Atraumatic. Nose: No congestion/rhinnorhea. Mouth/Throat: Mucous membranes are moist.  Oropharynx non-erythematous. Neck: No stridor.   Cardiovascular: Bradycardic rate, regular rhythm. Grossly normal heart sounds.  Good peripheral circulation. Respiratory: Normal respiratory effort.  No retractions. Lungs CTAB. No reproducible tenderness to palpation. Gastrointestinal: Soft and nontender. No distention. No abdominal bruits. No CVA tenderness. Musculoskeletal: No lower extremity tenderness  nor edema.  No joint effusions. Neurologic:  Normal speech and language. No gross focal neurologic deficits are appreciated.  Skin:  Skin is warm, dry and intact. No rash noted. Psychiatric: Mood and affect are normal. Speech and behavior are normal.  ____________________________________________   LABS (all labs ordered are listed, but only abnormal results are displayed)  Labs Reviewed  CBC WITH DIFFERENTIAL/PLATELET - Abnormal; Notable for the following:    Lymphs Abs 3.7 (*)    All other components within normal limits  COMPREHENSIVE METABOLIC PANEL - Abnormal; Notable for the following:    Chloride 99 (*)    BUN 22 (*)    Creatinine, Ser 1.01 (*)    Total Protein 6.4 (*)    AST 114 (*)    ALT 59 (*)    GFR calc non Af Amer 59 (*)    All other components within normal limits  LIPASE, BLOOD - Abnormal; Notable for the following:    Lipase 62 (*)    All other components within normal limits  TROPONIN I  BRAIN NATRIURETIC PEPTIDE  TROPONIN I  TROPONIN I  TROPONIN I   ____________________________________________  EKG  ED ECG REPORT I, Lyndsy Gilberto J, the attending physician, personally viewed and interpreted this ECG.   Date: 08/05/2015  EKG Time: 2328  Rate: 46  Rhythm: sinus bradycardia  Axis: normal  Intervals:none  ST&T Change: nonspecific  ____________________________________________  RADIOLOGY  Chest 2 view (view by me, interpreted per Dr. Nevada Crane): Negative, no acute cardiopulmonary abnormality.  Ultrasound abdomen interpreted per Dr. Gerilyn Nestle: Surgical absence of the gallbladder. No bile duct dilatation. Normal ultrasound appearance of the liver. ____________________________________________   PROCEDURES  Procedure(s) performed: None  Critical Care performed: No  ____________________________________________   INITIAL IMPRESSION / ASSESSMENT AND PLAN / ED COURSE  Pertinent labs & imaging results that were available during my care of the patient  were reviewed by me and considered in my medical decision making (see chart for details).  64 year old female who presents with chest pain which has been increasing in intensity. Noted mildly elevated LFTs and lipase. Will obtain RUQ ultrasound to evaluate for retained stone.   ----------------------------------------- 3:10 AM on 08/05/2015 -----------------------------------------  Updated patient and daughter of ultrasound results. Discussed with hospitalist who will evaluate patient in the emergency department for admission. ____________________________________________   FINAL CLINICAL IMPRESSION(S) / ED DIAGNOSES  Final diagnoses:  Chest pain, unspecified chest pain type      Paulette Blanch, MD 08/05/15 [id number]

## 2015-08-06 ENCOUNTER — Encounter: Payer: Self-pay | Admitting: Internal Medicine

## 2015-08-06 ENCOUNTER — Encounter
Admission: EM | Disposition: A | Discharge status: Home / Self Care | Payer: Self-pay | Source: Home / Self Care | Attending: Emergency Medicine

## 2015-08-06 HISTORY — PX: CARDIAC CATHETERIZATION: SHX172

## 2015-08-06 LAB — COMPREHENSIVE METABOLIC PANEL
ALT: 569 U/L — ABNORMAL HIGH (ref 14–54)
ANION GAP: 5 (ref 5–15)
AST: 415 U/L — ABNORMAL HIGH (ref 15–41)
Albumin: 3.7 g/dL (ref 3.5–5.0)
Alkaline Phosphatase: 118 U/L (ref 38–126)
BILIRUBIN TOTAL: 0.3 mg/dL (ref 0.3–1.2)
BUN: 20 mg/dL (ref 6–20)
CO2: 31 mmol/L (ref 22–32)
Calcium: 8.7 mg/dL — ABNORMAL LOW (ref 8.9–10.3)
Chloride: 104 mmol/L (ref 101–111)
Creatinine, Ser: 0.86 mg/dL (ref 0.44–1.00)
GFR calc Af Amer: >60 mL/min (ref >60)
Glucose, Bld: 127 mg/dL — ABNORMAL HIGH (ref 65–99)
POTASSIUM: 4.3 mmol/L (ref 3.5–5.1)
Sodium: 140 mmol/L (ref 135–145)
TOTAL PROTEIN: 6 g/dL — AB (ref 6.5–8.1)

## 2015-08-06 SURGERY — LEFT HEART CATH AND CORONARY ANGIOGRAPHY
Anesthesia: Moderate Sedation

## 2015-08-06 MED ORDER — ONDANSETRON HCL 4 MG/2ML IJ SOLN: 4.0000 mg | Freq: Four times a day (QID) | INTRAMUSCULAR | Status: DC | PRN

## 2015-08-06 MED ORDER — HEPARIN (PORCINE) IN NACL 2-0.9 UNIT/ML-% IJ SOLN
INTRAMUSCULAR | Status: AC
  Filled 2015-08-06: qty 1000

## 2015-08-06 MED ORDER — SODIUM CHLORIDE 0.9 % IV SOLN: 250.0000 mL | INTRAVENOUS | Status: DC | PRN

## 2015-08-06 MED ORDER — FAMOTIDINE 20 MG PO TABS
20.0000 mg | ORAL_TABLET | Freq: Once | ORAL | Status: AC
  Administered 2015-08-06: 20 mg via ORAL

## 2015-08-06 MED ORDER — FENTANYL CITRATE (PF) 100 MCG/2ML IJ SOLN
INTRAMUSCULAR | Status: AC
  Filled 2015-08-06: qty 2

## 2015-08-06 MED ORDER — SODIUM CHLORIDE 0.9 % IJ SOLN: 3.0000 mL | Freq: Two times a day (BID) | INTRAMUSCULAR | Status: DC

## 2015-08-06 MED ORDER — IOHEXOL 300 MG/ML  SOLN
INTRAMUSCULAR | Status: DC | PRN
  Administered 2015-08-06: 90 mL via INTRA_ARTERIAL

## 2015-08-06 MED ORDER — SODIUM CHLORIDE 0.9 % WEIGHT BASED INFUSION: 3.0000 mL/kg/h | INTRAVENOUS | Status: AC

## 2015-08-06 MED ORDER — FAMOTIDINE 20 MG PO TABS
ORAL_TABLET | ORAL | Status: AC
  Filled 2015-08-06: qty 1

## 2015-08-06 MED ORDER — MIDAZOLAM HCL 2 MG/2ML IJ SOLN
INTRAMUSCULAR | Status: AC
  Filled 2015-08-06: qty 2

## 2015-08-06 MED ORDER — MIDAZOLAM HCL 2 MG/2ML IJ SOLN
INTRAMUSCULAR | Status: DC | PRN
  Administered 2015-08-06 (×2): 1 mg via INTRAVENOUS

## 2015-08-06 MED ORDER — SODIUM CHLORIDE 0.9 % IJ SOLN: 3.0000 mL | INTRAMUSCULAR | Status: DC | PRN

## 2015-08-06 MED ORDER — METHYLPREDNISOLONE SODIUM SUCC 125 MG IJ SOLR
INTRAMUSCULAR | Status: AC
  Administered 2015-08-06: 125 mg
  Filled 2015-08-06: qty 2

## 2015-08-06 MED ORDER — FENTANYL CITRATE (PF) 100 MCG/2ML IJ SOLN
INTRAMUSCULAR | Status: DC | PRN
  Administered 2015-08-06 (×2): 50 ug via INTRAVENOUS

## 2015-08-06 MED ORDER — ACETAMINOPHEN 325 MG PO TABS: 650.0000 mg | ORAL_TABLET | ORAL | Status: DC | PRN

## 2015-08-06 MED ORDER — METHYLPREDNISOLONE SODIUM SUCC 125 MG IJ SOLR: 125.0000 mg | Freq: Once | INTRAMUSCULAR | Status: DC

## 2015-08-06 MED ORDER — SODIUM CHLORIDE 0.9 % IV SOLN
INTRAVENOUS | Status: AC
  Administered 2015-08-06: 11:00:00 via INTRAVENOUS

## 2015-08-06 SURGICAL SUPPLY — 10 items
CATH INFINITI 5FR ANG PIGTAIL (CATHETERS) ×3 IMPLANT
CATH INFINITI 5FR JL4 (CATHETERS) ×3 IMPLANT
CATH INFINITI JR4 5F (CATHETERS) ×3 IMPLANT
DEVICE CLOSURE MYNXGRIP 5F (Vascular Products) IMPLANT
KIT MANI 3VAL PERCEP (MISCELLANEOUS) ×3 IMPLANT
NDL PERC 18GX7CM (NEEDLE) ×1 IMPLANT
NEEDLE PERC 18GX7CM (NEEDLE) ×3 IMPLANT
PACK CARDIAC CATH (CUSTOM PROCEDURE TRAY) ×3 IMPLANT
SHEATH AVANTI 5FR X 11CM (SHEATH) ×3 IMPLANT
WIRE EMERALD 3MM-J .035X150CM (WIRE) ×3 IMPLANT

## 2015-08-06 NOTE — Consult Note (Signed)
Reason for Consult: chest pain angina Referring Physician: Dr Bridgett Larsson  hospitalist  Sonia Robinson is an 64 y.o. female.  HPI:  Patient presents with complaints of chest pain anginal symptoms she has a long history of a anxiety and depression but has recently had significant abdominal discomfort and chest pain which shortness of breath so presented to the emergency room and was subsequently admitted. Patient has hyperlipidemia she is smokes and has reflux symptoms. Patient presented and had a functional study which was abnormal now cardiology is being recommended for further evaluation. Patient denies any prior cardiac history. Complains of chronic back pain chronic pain in general long history of depression and anxiety  Past Medical History  Diagnosis Date  . Spasmodic colon   . Nasal inflammation due to allergen   . Asthma   . Cataract   . Hemorrhoids   . High cholesterol   . Low back pain   . Sleep disorder   . Osteoarthritis   . Prolapse of vaginal vault after hysterectomy   . Restless leg syndrome   . Sleep apnea   . Loss of bladder control   . Disorder of sacrum   . Movement disorder   . Tremor   . Obesity   . Chronic pain syndrome   . Degenerative joint disease of ankle and foot   . Pain in thoracic spine   . Involuntary muscle contractions   . Irritable bowel syndrome   . 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 Fallen bladder   . Vaginal atrophy     History reviewed. No pertinent past surgical history.  History reviewed. No pertinent family history.  Social History:  reports that she has been smoking Cigarettes.  She has been smoking about 0.25 packs per day. She has never used smokeless tobacco. She reports that she drinks about 2.4 oz of alcohol per week. She reports that she does not use illicit drugs.  Allergies:  Allergies  Allergen Reactions  . Bee Venom Anaphylaxis  . Betadine [Povidone Iodine] Shortness Of Breath  . Morphine And Related Anaphylaxis  .  Codeine Swelling  . Depakote [Divalproex Sodium] Hives  . Naproxen Swelling  . Augmentin [Amoxicillin-Pot Clavulanate] Rash  . Latex Rash  . Penicillins Rash  . Shellfish Allergy Nausea And Vomiting, Swelling and Rash  . Sulfa Antibiotics Itching    Medications: I have reviewed the patient's current medications.  Results for orders placed or performed during the hospital encounter of 08/04/15 (from the past 48 hour(s))  CBC with Differential     Status: Abnormal   Collection Time: 08/04/15 11:36 PM  Result Value Ref Range   WBC 9.3 3.6 - 11.0 K/uL   RBC 4.25 3.80 - 5.20 MIL/uL   Hemoglobin 13.5 12.0 - 16.0 g/dL   HCT 40.6 35.0 - 47.0 %   MCV 95.5 80.0 - 100.0 fL   MCH 31.8 26.0 - 34.0 pg   MCHC 33.3 32.0 - 36.0 g/dL   RDW 14.0 11.5 - 14.5 %   Platelets 208 150 - 440 K/uL   Neutrophils Relative % 50 %   Neutro Abs 4.7 1.4 - 6.5 K/uL   Lymphocytes Relative 39 %   Lymphs Abs 3.7 (H) 1.0 - 3.6 K/uL   Monocytes Relative 9 %   Monocytes Absolute 0.8 0.2 - 0.9 K/uL   Eosinophils Relative 1 %   Eosinophils Absolute 0.1 0 - 0.7 K/uL   Basophils Relative 1 %   Basophils Absolute 0.1 0 -  0.1 K/uL  Comprehensive metabolic panel     Status: Abnormal   Collection Time: 08/04/15 11:36 PM  Result Value Ref Range   Sodium 137 135 - 145 mmol/L   Potassium 3.5 3.5 - 5.1 mmol/L   Chloride 99 (L) 101 - 111 mmol/L   CO2 30 22 - 32 mmol/L   Glucose, Bld 99 65 - 99 mg/dL   BUN 22 (H) 6 - 20 mg/dL   Creatinine, Ser 1.01 (H) 0.44 - 1.00 mg/dL   Calcium 9.1 8.9 - 10.3 mg/dL   Total Protein 6.4 (L) 6.5 - 8.1 g/dL   Albumin 4.0 3.5 - 5.0 g/dL   AST 114 (H) 15 - 41 U/L   ALT 59 (H) 14 - 54 U/L   Alkaline Phosphatase 66 38 - 126 U/L   Total Bilirubin 0.6 0.3 - 1.2 mg/dL   GFR calc non Af Amer 59 (L) >60 mL/min   GFR calc Af Amer >60 >60 mL/min    Comment: (NOTE) The eGFR has been calculated using the CKD EPI equation. This calculation has not been validated in all clinical  situations. eGFR's persistently <60 mL/min signify possible Chronic Kidney Disease.    Anion gap 8 5 - 15  Lipase, blood     Status: Abnormal   Collection Time: 08/04/15 11:36 PM  Result Value Ref Range   Lipase 62 (H) 22 - 51 U/L  Troponin I     Status: None   Collection Time: 08/04/15 11:36 PM  Result Value Ref Range   Troponin I <0.03 <0.031 ng/mL    Comment:        NO INDICATION OF MYOCARDIAL INJURY.   Brain natriuretic peptide     Status: None   Collection Time: 08/04/15 11:36 PM  Result Value Ref Range   B Natriuretic Peptide 54.0 0.0 - 100.0 pg/mL  Troponin I     Status: None   Collection Time: 08/05/15  5:10 AM  Result Value Ref Range   Troponin I <0.03 <0.031 ng/mL    Comment:        NO INDICATION OF MYOCARDIAL INJURY.   Troponin I     Status: None   Collection Time: 08/05/15  2:28 PM  Result Value Ref Range   Troponin I <0.03 <0.031 ng/mL    Comment:        NO INDICATION OF MYOCARDIAL INJURY.   Comprehensive metabolic panel     Status: Abnormal   Collection Time: 08/06/15  5:38 AM  Result Value Ref Range   Sodium 140 135 - 145 mmol/L   Potassium 4.3 3.5 - 5.1 mmol/L   Chloride 104 101 - 111 mmol/L   CO2 31 22 - 32 mmol/L   Glucose, Bld 127 (H) 65 - 99 mg/dL   BUN 20 6 - 20 mg/dL   Creatinine, Ser 0.86 0.44 - 1.00 mg/dL   Calcium 8.7 (L) 8.9 - 10.3 mg/dL   Total Protein 6.0 (L) 6.5 - 8.1 g/dL   Albumin 3.7 3.5 - 5.0 g/dL   AST 415 (H) 15 - 41 U/L   ALT 569 (H) 14 - 54 U/L   Alkaline Phosphatase 118 38 - 126 U/L   Total Bilirubin 0.3 0.3 - 1.2 mg/dL   GFR calc non Af Amer >60 >60 mL/min   GFR calc Af Amer >60 >60 mL/min    Comment: (NOTE) The eGFR has been calculated using the CKD EPI equation. This calculation has not been validated in all clinical situations. eGFR's  persistently <60 mL/min signify possible Chronic Kidney Disease.    Anion gap 5 5 - 15    Dg Chest 2 View  08/05/2015  CLINICAL DATA:  64 year old female with left chest pain  radiating to the shoulder and back with nausea. Initial encounter. EXAM: CHEST  2 VIEW COMPARISON:  None. FINDINGS: Normal lung volumes. Normal cardiac size. Other mediastinal contours are within normal limits. Visualized tracheal air column is within normal limits. No pneumothorax, pulmonary edema, pleural effusion or confluent pulmonary opacity. Cholecystectomy clips. No acute osseous abnormality identified. IMPRESSION: Negative, no acute cardiopulmonary abnormality. Electronically Signed   By: Genevie Ann M.D.   On: 08/05/2015 00:49   Nm Myocar Multi W/spect W/wall Motion / Ef  08/05/2015   There was no ST segment deviation noted during stress.  Blood pressure demonstrated a blunted response to exercise.  No T wave inversion was noted during stress.  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  The left ventricular ejection fraction is moderately decreased (30-44%).  Nuclear stress EF: 33%.    US Abdomen Limited Ruq  08/05/2015  CLINICAL DATA:  Elevated liver function studies and lipase. Post cholecystectomy. Abdominal pain with nausea and vomiting for 2 months. EXAM: US ABDOMEN LIMITED - RIGHT UPPER QUADRANT COMPARISON:  None. FINDINGS: Gallbladder: Gallbladder is surgically absent. Common bile duct: Diameter: 6.6 mm, normal Liver: No focal lesion identified. Within normal limits in parenchymal echogenicity. IMPRESSION: Surgical absence of the gallbladder. No bile duct dilatation. Normal ultrasound appearance of the liver. Electronically Signed   By: Lucienne Capers M.D.   On: 08/05/2015 02:28    Review of Systems  Constitutional: Positive for malaise/fatigue.  Eyes: Negative.   Respiratory: Positive for shortness of breath.   Cardiovascular: Positive for chest pain and palpitations.  Gastrointestinal: Positive for heartburn and abdominal pain.  Genitourinary: Negative.   Musculoskeletal: Positive for back pain and joint pain.  Skin: Negative.   Neurological: Positive  for dizziness, weakness and headaches.  Endo/Heme/Allergies: Negative.   Psychiatric/Behavioral: Positive for depression. The patient is nervous/anxious.    Blood pressure 112/58, pulse 58, temperature 98.6 F (37 C), temperature source Oral, resp. rate 18, height $RemoveBe'5\' 7"'[id number]$  (1.702 m), weight 90.901 kg (200 lb 6.4 oz), SpO2 98 %. Physical Exam  Nursing note and vitals reviewed. Constitutional: She is oriented to person, place, and time. She appears well-developed and well-nourished.  HENT:  Head: Normocephalic and atraumatic.  Eyes: Conjunctivae and EOM are normal. Pupils are equal, round, and reactive to light.  Neck: Normal range of motion. Neck supple.  Cardiovascular: Normal rate, regular rhythm and normal heart sounds.   Respiratory: Effort normal and breath sounds normal.  GI: Soft. Bowel sounds are normal.  Musculoskeletal: Normal range of motion.  Neurological: She is alert and oriented to person, place, and time. She has normal reflexes.  Skin: Skin is warm and dry.  Psychiatric: Her speech is normal and behavior is normal. Judgment and thought content normal. Her mood appears anxious. Cognition and memory are normal. She exhibits a depressed mood.    Assessment/Plan:  chest pain  possible angina  hyperlipidemia  GERD  COPD asthma  anxiety  depression  chronic pain  elevated liver enzymes  smoking . PLAN ROMI a myocardial infarction  agree with functional study for evaluation of chest pain  continue Protonix and Pepcid for reflux symptom  consider holding or discontinuing Lipitor because of elevated liver enzymes  continue pain control for chronic pain symptoms  advised patient to quit smoking  because of abnormal functional study recommend cardiac catheterization for evaluation of continue           chest pain  continue treatment for anxiety  continue current therapy for depression   Chayanne Speir D. 08/06/2015, 1:24 PM

## 2015-08-06 NOTE — Discharge Summary (Signed)
[facility] [place] NAME: Sonia Robinson    MR#:  [id number]  DATE OF BIRTH:  [birth year]  DATE OF ADMISSION:  08/04/2015 ADMITTING PHYSICIAN: Fritzi Mandes, MD  DATE OF DISCHARGE: 08/05/2015 PRIMARY CARE PHYSICIAN: PROVIDER NOT IN SYSTEM    ADMISSION DIAGNOSIS:  Chest pain [R07.9] Chest pain, unspecified chest pain type [R07.9]   DISCHARGE DIAGNOSIS:   Atypical chest pain. SECONDARY DIAGNOSIS:   Past Medical History  Diagnosis Date  . Spasmodic colon   . Nasal inflammation due to allergen   . Asthma   . Cataract   . Hemorrhoids   . High cholesterol   . Low back pain   . Sleep disorder   . Osteoarthritis   . Prolapse of vaginal vault after hysterectomy   . Restless leg syndrome   . Sleep apnea   . Loss of bladder control   . Disorder of sacrum   . Movement disorder   . Tremor   . Obesity   . Chronic pain syndrome   . Degenerative joint disease of ankle and foot   . Pain in thoracic spine   . Involuntary muscle contractions   . Irritable bowel syndrome   . 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 Fallen bladder   . Vaginal atrophy     HOSPITAL COURSE:   1. Atypical chest pain. Patient reports pain under the left breast bone. It is reproducible on palpation. Likely musculoskeletal. EKG does not show any ST-T changes. Cardiac enzyme negative. She is on aspirin and when necessary nitroglycerin. She got cardiac cath today which is normal due to abnormal stress test.   2. Hyperlipidemia continue lipitor.  3. Bradycardia as noted on EKG. Patient asymptomatic   * Tobacco abuse. She restarted smoking 3 months ago, 1 pack a day. Smoking cessation was counseled for 3-5 min.  DISCHARGE CONDITIONS:   Stable, discharge to home today.  CONSULTS OBTAINED:  Treatment Team:  Yolonda Kida, MD  DRUG ALLERGIES:   Allergies  Allergen Reactions  . Bee Venom Anaphylaxis  . Betadine [Povidone Iodine] Shortness Of  Breath  . Morphine And Related Anaphylaxis  . Codeine Swelling  . Depakote [Divalproex Sodium] Hives  . Naproxen Swelling  . Augmentin [Amoxicillin-Pot Clavulanate] Rash  . Latex Rash  . Penicillins Rash  . Shellfish Allergy Nausea And Vomiting, Swelling and Rash  . Sulfa Antibiotics Itching    DISCHARGE MEDICATIONS:   Current Discharge Medication List    START taking these medications   Details  aspirin EC 81 MG EC tablet Take 1 tablet (81 mg total) by mouth daily. Qty: 30 tablet, Refills: 0      CONTINUE these medications which have NOT CHANGED   Details  albuterol (PROVENTIL HFA;VENTOLIN HFA) 108 (90 BASE) MCG/ACT inhaler Inhale 2 puffs into the lungs every 6 (six) hours as needed for wheezing or shortness of breath.    alosetron (LOTRONEX) 1 MG tablet Take 1 mg by mouth 2 (two) times daily.    atorvastatin (LIPITOR) 40 MG tablet Take 40 mg by mouth every evening.    budesonide-formoterol (SYMBICORT) 160-4.5 MCG/ACT inhaler Inhale 2 puffs into the lungs 2 (two) times daily.    busPIRone (BUSPAR) 15 MG tablet Take 30 mg by mouth 2 (two) times daily.    cetirizine (ZYRTEC) 10 MG tablet Take 10 mg by mouth daily.    cholecalciferol (VITAMIN D) 400 UNITS TABS tablet Take 400 Units by mouth daily.  clonazePAM (KLONOPIN) 1 MG tablet Take 1 mg by mouth 3 (three) times daily as needed for anxiety.    conjugated estrogens (PREMARIN) vaginal cream Place 1 Applicatorful vaginally 2 (two) times a week.    cycloSPORINE (RESTASIS) 0.05 % ophthalmic emulsion Place 1 drop into both eyes 2 (two) times daily.    diclofenac sodium (VOLTAREN) 1 % GEL Apply 2 g topically 4 (four) times daily.    dicyclomine (BENTYL) 20 MG tablet Take 20 mg by mouth every 6 (six) hours.    diphenoxylate-atropine (LOMOTIL) 2.5-0.025 MG tablet Take 1 tablet by mouth 3 (three) times daily as needed for diarrhea or loose stools.    EPINEPHrine (EPIPEN 2-PAK) 0.3 mg/0.3 mL IJ SOAJ injection Inject 0.3 mg  into the muscle once.    esomeprazole (NEXIUM) 40 MG capsule Take 40 mg by mouth daily before breakfast.    FLUoxetine (PROZAC) 20 MG capsule Take 60 mg by mouth daily.    fluticasone (FLONASE) 50 MCG/ACT nasal spray Place 1 spray into both nostrils daily.    HYDROcodone-acetaminophen (NORCO/VICODIN) 5-325 MG tablet Take 1 tablet by mouth every 8 (eight) hours as needed for moderate pain.    hydrocortisone (PROCTOSOL HC) 2.5 % rectal cream Place 1 application rectally daily as needed for hemorrhoids or itching.    lidocaine (XYLOCAINE) 5 % ointment Apply 1 application topically 2 (two) times daily as needed for mild pain.    minocycline (MINOCIN,DYNACIN) 50 MG capsule Take 50 mg by mouth daily.    montelukast (SINGULAIR) 10 MG tablet Take 10 mg by mouth at bedtime.    multivitamin-lutein (OCUVITE-LUTEIN) CAPS capsule Take 1 capsule by mouth daily.    olopatadine (PATANOL) 0.1 % ophthalmic solution Place 1 drop into both eyes daily.    pyridOXINE (VITAMIN B-6) 100 MG tablet Take 200 mg by mouth daily.    ramelteon (ROZEREM) 8 MG tablet Take 8 mg by mouth at bedtime.    rOPINIRole (REQUIP) 1 MG tablet Take 1 mg by mouth 2 (two) times daily. One tablet at 1800 and one tablet at bedtime    solifenacin (VESICARE) 5 MG tablet Take 5 mg by mouth daily.    sulindac (CLINORIL) 200 MG tablet Take 200 mg by mouth daily.    temazepam (RESTORIL) 30 MG capsule Take 30 mg by mouth at bedtime.    tiotropium (SPIRIVA) 18 MCG inhalation capsule Place 18 mcg into inhaler and inhale daily.    urea (CARMOL) 40 % ointment Apply 1 application topically every evening.      STOP taking these medications     baclofen (LIORESAL) 10 MG tablet          DISCHARGE INSTRUCTIONS:    If you experience worsening of your admission symptoms, develop shortness of breath, life threatening emergency, suicidal or homicidal thoughts you must seek medical attention immediately by calling 911 or calling your  MD immediately  if symptoms less severe.  You Must read complete instructions/literature along with all the possible adverse reactions/side effects for all the Medicines you take and that have been prescribed to you. Take any new Medicines after you have completely understood and accept all the possible adverse reactions/side effects.   Please note  You were cared for by a hospitalist during your hospital stay. If you have any questions about your discharge medications or the care you received while you were in the hospital after you are discharged, you can call the unit and asked to speak with the hospitalist on call if  the hospitalist that took care of you is not available. Once you are discharged, your primary care physician will handle any further medical issues. Please note that NO REFILLS for any discharge medications will be authorized once you are discharged, as it is imperative that you return to your primary care physician (or establish a relationship with a primary care physician if you do not have one) for your aftercare needs so that they can reassess your need for medications and monitor your lab values.    Today   SUBJECTIVE   Left side chest pain under breast.   VITAL SIGNS:  Blood pressure 112/58, pulse 58, temperature 98.6 F (37 C), temperature source Oral, resp. rate 18, height 5\' 7"  (1.702 m), weight 90.901 kg (200 lb 6.4 oz), SpO2 98 %.  I/O:    Intake/Output Summary (Last 24 hours) at 08/06/15 1445 Last data filed at 08/05/15 1630  Gross per 24 hour  Intake    240 ml  Output      0 ml  Net    240 ml    PHYSICAL EXAMINATION:  GENERAL:  64 y.o.-year-old patient lying in the bed with no acute distress.  EYES: Pupils equal, round, reactive to light and accommodation. No scleral icterus. Extraocular muscles intact.  HEENT: Head atraumatic, normocephalic. Oropharynx and nasopharynx clear.  NECK:  Supple, no jugular venous distention. No thyroid enlargement, no  tenderness.  LUNGS: Normal breath sounds bilaterally, no wheezing, rales,rhonchi or crepitation. No use of accessory muscles of respiration.  Tenderness on palpation on ribs under left breast. CARDIOVASCULAR: S1, S2 normal. No murmurs, rubs, or gallops.  ABDOMEN: Soft, non-tender, non-distended. Bowel sounds present. No organomegaly or mass.  EXTREMITIES: No pedal edema, cyanosis, or clubbing.  NEUROLOGIC: Cranial nerves II through XII are intact. Muscle strength 5/5 in all extremities. Sensation intact. Gait not checked.  PSYCHIATRIC: The patient is alert and oriented x 3.  SKIN: No obvious rash, lesion, or ulcer.   DATA REVIEW:   CBC  Recent Labs Lab 08/04/15 2336  WBC 9.3  HGB 13.5  HCT 40.6  PLT 208    Chemistries   Recent Labs Lab 08/06/15 0538  NA 140  K 4.3  CL 104  CO2 31  GLUCOSE 127*  BUN 20  CREATININE 0.86  CALCIUM 8.7*  AST 415*  ALT 569*  ALKPHOS 118  BILITOT 0.3    Cardiac Enzymes  Recent Labs Lab 08/05/15 1428  [place] <0.03    Microbiology Results  No results found for this or any previous visit.  RADIOLOGY:  Dg Chest 2 View  08/05/2015  CLINICAL DATA:  64 year old female with left chest pain radiating to the shoulder and back with nausea. Initial encounter. EXAM: CHEST  2 VIEW COMPARISON:  None. FINDINGS: Normal lung volumes. Normal cardiac size. Other mediastinal contours are within normal limits. Visualized tracheal air column is within normal limits. No pneumothorax, pulmonary edema, pleural effusion or confluent pulmonary opacity. Cholecystectomy clips. No acute osseous abnormality identified. IMPRESSION: Negative, no acute cardiopulmonary abnormality. Electronically Signed   By: Genevie Ann M.D.   On: 08/05/2015 00:49   Nm Myocar Multi W/spect W/wall Motion / Ef  08/05/2015   There was no ST segment deviation noted during stress.  Blood pressure demonstrated a blunted response to exercise.  No T wave inversion was noted during stress.   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  The left ventricular ejection fraction is moderately decreased (30-44%).  Nuclear  stress EF: 33%.    US Abdomen Limited Ruq  08/05/2015  CLINICAL DATA:  Elevated liver function studies and lipase. Post cholecystectomy. Abdominal pain with nausea and vomiting for 2 months. EXAM: US ABDOMEN LIMITED - RIGHT UPPER QUADRANT COMPARISON:  None. FINDINGS: Gallbladder: Gallbladder is surgically absent. Common bile duct: Diameter: 6.6 mm, normal Liver: No focal lesion identified. Within normal limits in parenchymal echogenicity. IMPRESSION: Surgical absence of the gallbladder. No bile duct dilatation. Normal ultrasound appearance of the liver. Electronically Signed   By: Lucienne Capers M.D.   On: 08/05/2015 02:28      I discussed with Dr. Clayborn Bigness, cardiologist. Management plans discussed with the patient, her daughter and they are in agreement.  CODE STATUS:     Code Status Orders        Start     Ordered   08/05/15 0415  Full code   Continuous     08/05/15 0414    Advance Directive Documentation        Most Recent Value   Type of Advance Directive  Healthcare Power of Attorney, Living will   Pre-existing out of facility DNR order (yellow form or pink MOST form)     "MOST" Form in Place?        TOTAL TIME TAKING CARE OF THIS PATIENT: 41 minutes.    Demetrios Loll M.D on 08/06/2015 at 2:45 PM  Between 7am to 6pm - Pager - [phone]  After 6pm go to [email or web address] - password [email or web address]  Office  [phone]  CC: Primary care physician; PROVIDER NOT IN SYSTEM

## 2015-08-06 NOTE — Progress Notes (Signed)
Subjective:   status post cardiac catheterization pain free doing well. Ready to go home is still concerned as to why she is having occasional chest pains.  Objective:  Vital Signs in the last 24 hours: Temp:  [97.8 F (36.6 C)-98.6 F (37 C)] 98.6 F (37 C) (10/14 0849) Pulse Rate:  [50-62] 58 (10/14 1140) Resp:  [10-20] 18 (10/14 1140) BP: (96-118)/(42-61) 112/58 mmHg (10/14 1140) SpO2:  [96 %-100 %] 98 % (10/14 1140) Weight:  [88.14 kg (194 lb 5 oz)-90.901 kg (200 lb 6.4 oz)] 90.901 kg (200 lb 6.4 oz) (10/14 0558)  Intake/Output from previous day: 10/13 0701 - 10/14 0700 In: 240 [P.O.:240] Out: 0  Intake/Output from this shift:    Physical Exam: General appearance: cooperative and appears stated age Neck: no adenopathy, no carotid bruit, no JVD, supple, symmetrical, trachea midline and thyroid not enlarged, symmetric, no tenderness/mass/nodules Lungs: clear to auscultation bilaterally Heart: regular rate and rhythm, S1, S2 normal, no murmur, click, rub or gallop Abdomen: soft, non-tender; bowel sounds normal; no masses,  no organomegaly Extremities: extremities normal, atraumatic, no cyanosis or edema Pulses: 2+ and symmetric Skin: Skin color, texture, turgor normal. No rashes or lesions Neurologic: Alert and oriented X 3, normal strength and tone. Normal symmetric reflexes. Normal coordination and gait  Lab Results:  Recent Labs  08/04/15 2336  WBC 9.3  HGB 13.5  PLT 208    Recent Labs  08/04/15 2336 08/06/15 0538  NA 137 140  K 3.5 4.3  CL 99* 104  CO2 30 31  GLUCOSE 99 127*  BUN 22* 20  CREATININE 1.01* 0.86    Recent Labs  08/05/15 0510 08/05/15 1428  TROPONINI <0.03 <0.03   Hepatic Function Panel  Recent Labs  08/06/15 0538  PROT 6.0*  ALBUMIN 3.7  AST 415*  ALT 569*  ALKPHOS 118  BILITOT 0.3   No results for input(s): CHOL in the last 72 hours. No results for input(s): PROTIME in the last 72 hours.  Imaging: Imaging results have  been reviewed  Cardiac Studies:  Assessment/Plan:  Chest Pain Palpitations Shortness of Breath  GERD Anxiety  hyper lipidemia  asthma COPD  chronic pain  abnormal functional study . PLAN   status post cardiac catheterization relatively normal no significant coronary disease  continue treatment for reflux  inhalers for COPD asthma  aspirin low-dose daily for preventive therapy  continue treatment for anxiety symptoms  Protonix for GERD symptoms consider GI workup as an outpatient  Lipitor therapy for lipid management  relative low blood pressure consider reducing some over pain medication  elevated liver enzymes considered discontinue Lipitor GI workup  outpatient follow-up with Cardiology as an outpatient if necessary   Dellis Voght D. 08/06/2015, 1:17 PM

## 2015-08-06 NOTE — Progress Notes (Signed)
Discharge instructions explained to pt and pts daughter/ verbalized an understanding/ iv and tele removed/ rx given to pt/ transported off unit via wheelchair

## 2015-08-06 NOTE — Progress Notes (Signed)
Pt doing well post heart cath, manual hold to right groin, dressing c/d/i no bleeding nor hematoma, family present, questions answsered,vss. Report called to care nurse with plan reviewed, may sit up @ 1205

## 2015-12-02 DIAGNOSIS — R4189 Other symptoms and signs involving cognitive functions and awareness: Secondary | ICD-10-CM | POA: Insufficient documentation

## 2017-03-20 ENCOUNTER — Encounter: Payer: Self-pay | Admitting: Emergency Medicine

## 2017-03-20 ENCOUNTER — Emergency Department: Payer: No Typology Code available for payment source

## 2017-03-20 ENCOUNTER — Emergency Department
Admission: EM | Admit: 2017-03-20 | Discharge: 2017-03-20 | Disposition: A | Discharge status: Home / Self Care | Payer: No Typology Code available for payment source | Attending: Emergency Medicine | Admitting: Emergency Medicine

## 2017-03-20 DIAGNOSIS — Y92410 Unspecified street and highway as the place of occurrence of the external cause: Secondary | ICD-10-CM | POA: Insufficient documentation

## 2017-03-20 DIAGNOSIS — S060X0A Concussion without loss of consciousness, initial encounter: Secondary | ICD-10-CM | POA: Diagnosis not present

## 2017-03-20 DIAGNOSIS — F17210 Nicotine dependence, cigarettes, uncomplicated: Secondary | ICD-10-CM | POA: Insufficient documentation

## 2017-03-20 DIAGNOSIS — Y999 Unspecified external cause status: Secondary | ICD-10-CM | POA: Insufficient documentation

## 2017-03-20 DIAGNOSIS — S0083XA Contusion of other part of head, initial encounter: Secondary | ICD-10-CM | POA: Insufficient documentation

## 2017-03-20 DIAGNOSIS — Y939 Activity, unspecified: Secondary | ICD-10-CM | POA: Insufficient documentation

## 2017-03-20 DIAGNOSIS — Z79899 Other long term (current) drug therapy: Secondary | ICD-10-CM | POA: Diagnosis not present

## 2017-03-20 DIAGNOSIS — S0990XA Unspecified injury of head, initial encounter: Secondary | ICD-10-CM | POA: Diagnosis present

## 2017-03-20 DIAGNOSIS — J45909 Unspecified asthma, uncomplicated: Secondary | ICD-10-CM | POA: Diagnosis not present

## 2017-03-20 MED ORDER — HYDROCODONE-ACETAMINOPHEN 5-325 MG PO TABS: 1.0000 | ORAL_TABLET | Freq: Four times a day (QID) | ORAL | 0 refills | Status: DC | PRN

## 2017-03-20 MED ORDER — ACETAMINOPHEN 325 MG PO TABS
650.0000 mg | ORAL_TABLET | Freq: Once | ORAL | Status: AC
  Administered 2017-03-20: 650 mg via ORAL
  Filled 2017-03-20: qty 2

## 2017-03-20 MED ORDER — HYDROCODONE-ACETAMINOPHEN 5-325 MG PO TABS
1.0000 | ORAL_TABLET | ORAL | Status: AC
  Administered 2017-03-20: 1 via ORAL
  Filled 2017-03-20: qty 1

## 2017-03-20 NOTE — Discharge Instructions (Signed)
Please take Norco 5-325, one tab by mouth every 4-6 hours as needed for pain. Use additional Tylenol as needed for pain, do not exceed greater than 4000 mg of Tylenol in a 24-hour period. Avoid bright lights and physical activity that reproduces headache. Please rest. Avoid driving for 24 hours. Return to the ER for any sudden onset severe worsening headache, vomiting, worsening symptoms urgent changes in her health.
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
## 2017-11-13 ENCOUNTER — Other Ambulatory Visit (HOSPITAL_COMMUNITY): Payer: Self-pay | Admitting: Orthopedic Surgery

## 2017-11-13 ENCOUNTER — Emergency Department (HOSPITAL_COMMUNITY): Payer: Medicare Other

## 2017-11-13 ENCOUNTER — Emergency Department (HOSPITAL_COMMUNITY)
Admission: EM | Admit: 2017-11-13 | Discharge: 2017-11-14 | Disposition: A | Discharge status: Home / Self Care | Payer: Medicare Other | Attending: Emergency Medicine | Admitting: Emergency Medicine

## 2017-11-13 ENCOUNTER — Other Ambulatory Visit: Payer: Self-pay

## 2017-11-13 ENCOUNTER — Encounter (HOSPITAL_COMMUNITY): Payer: Self-pay

## 2017-11-13 DIAGNOSIS — R0789 Other chest pain: Secondary | ICD-10-CM | POA: Insufficient documentation

## 2017-11-13 DIAGNOSIS — M79605 Pain in left leg: Secondary | ICD-10-CM | POA: Diagnosis present

## 2017-11-13 DIAGNOSIS — Z91040 Latex allergy status: Secondary | ICD-10-CM | POA: Insufficient documentation

## 2017-11-13 DIAGNOSIS — R0602 Shortness of breath: Secondary | ICD-10-CM | POA: Insufficient documentation

## 2017-11-13 DIAGNOSIS — Z79899 Other long term (current) drug therapy: Secondary | ICD-10-CM | POA: Insufficient documentation

## 2017-11-13 DIAGNOSIS — F17210 Nicotine dependence, cigarettes, uncomplicated: Secondary | ICD-10-CM | POA: Diagnosis not present

## 2017-11-13 DIAGNOSIS — J45909 Unspecified asthma, uncomplicated: Secondary | ICD-10-CM | POA: Diagnosis not present

## 2017-11-13 DIAGNOSIS — M7989 Other specified soft tissue disorders: Principal | ICD-10-CM

## 2017-11-13 LAB — BASIC METABOLIC PANEL
ANION GAP: 11 (ref 5–15)
BUN: 6 mg/dL (ref 6–20)
CHLORIDE: 102 mmol/L (ref 101–111)
CO2: 26 mmol/L (ref 22–32)
Calcium: 9.3 mg/dL (ref 8.9–10.3)
Creatinine, Ser: 0.9 mg/dL (ref 0.44–1.00)
GFR calc non Af Amer: >60 mL/min (ref >60)
Glucose, Bld: 98 mg/dL (ref 65–99)
POTASSIUM: 3.6 mmol/L (ref 3.5–5.1)
Sodium: 139 mmol/L (ref 135–145)

## 2017-11-13 LAB — CBC WITH DIFFERENTIAL/PLATELET
BASOS ABS: 0 10*3/uL (ref 0.0–0.1)
BASOS PCT: 1 %
Eosinophils Absolute: 0.1 10*3/uL (ref 0.0–0.7)
Eosinophils Relative: 2 %
HEMATOCRIT: 42.7 % (ref 36.0–46.0)
HEMOGLOBIN: 14.3 g/dL (ref 12.0–15.0)
Lymphocytes Relative: 42 %
Lymphs Abs: 2.4 10*3/uL (ref 0.7–4.0)
MCH: 32.6 pg (ref 26.0–34.0)
MCHC: 33.5 g/dL (ref 30.0–36.0)
MCV: 97.5 fL (ref 78.0–100.0)
MONOS PCT: 9 %
Monocytes Absolute: 0.5 10*3/uL (ref 0.1–1.0)
NEUTROS ABS: 2.7 10*3/uL (ref 1.7–7.7)
NEUTROS PCT: 46 %
Platelets: 239 10*3/uL (ref 150–400)
RBC: 4.38 MIL/uL (ref 3.87–5.11)
RDW: 13.7 % (ref 11.5–15.5)
WBC: 5.8 10*3/uL (ref 4.0–10.5)

## 2017-11-13 LAB — PROTIME-INR
INR: 0.85
Prothrombin Time: 11.5 seconds (ref 11.4–15.2)

## 2017-11-13 LAB — I-STAT TROPONIN, ED: Troponin i, poc: 0.01 ng/mL (ref 0.00–0.08)

## 2017-11-13 LAB — BRAIN NATRIURETIC PEPTIDE: B NATRIURETIC PEPTIDE 5: 105.6 pg/mL — AB (ref 0.0–100.0)

## 2017-11-13 LAB — APTT: APTT: 29 s (ref 24–36)

## 2017-11-13 LAB — D-DIMER, QUANTITATIVE (NOT AT ARMC): D DIMER QUANT: 0.47 ug{FEU}/mL (ref 0.00–0.50)

## 2017-11-13 MED ORDER — HYDROCORTISONE NA SUCCINATE PF 250 MG IJ SOLR
200.0000 mg | Freq: Once | INTRAMUSCULAR | Status: AC
  Administered 2017-11-14: 200 mg via INTRAVENOUS
  Filled 2017-11-13: qty 200

## 2017-11-13 MED ORDER — DIPHENHYDRAMINE HCL 25 MG PO CAPS
50.0000 mg | ORAL_CAPSULE | Freq: Once | ORAL | Status: AC
  Filled 2017-11-13: qty 2

## 2017-11-13 MED ORDER — DIPHENHYDRAMINE HCL 50 MG/ML IJ SOLN
50.0000 mg | Freq: Once | INTRAMUSCULAR | Status: AC
  Administered 2017-11-14: 50 mg via INTRAVENOUS
  Filled 2017-11-13: qty 1

## 2017-11-13 NOTE — ED Triage Notes (Signed)
Pt presents with 2 week h/o L leg swelling, (denies injury or long distance travel); reports swelling beginning to R leg; reports shortness of breath, especially with exertion.

## 2017-11-13 NOTE — ED Provider Notes (Signed)
[place] EMERGENCY DEPARTMENT Provider Note   CSN: [id number] Arrival date & time: 11/13/17  1946     History   Chief Complaint Chief Complaint  Patient presents with  . Leg Pain    HPI Sonia Robinson is a 67 y.o. female.  HPI Patient presents to the emergency room for evaluation of leg pain, swelling and shortness of breath.  Patient states she has had swelling in her left leg for the last couple of weeks.  The symptoms have been progressing.  She now has pain in her left foot that travels all the way up to her thigh.  She started notice redness and swelling.  Initially she thought it was her arthritis but as the symptoms progressed she decided to go see her doctor and have it evaluated.  She was seen today and was scheduled for an outpatient Doppler study tomorrow.  Patient was instructed if her symptoms got worse to come to the emergency room.  Patient felt like it got worse this evening.  She does have some shortness of breath with exertion as well.  She denies any prior history of DVT or PE. Past Medical History:  Diagnosis Date  . Asthma   . Cataract   . Chronic pain syndrome   . Degenerative joint disease of ankle and foot   . Disorder of sacrum   . Fallen bladder   . Hemorrhoids   . High cholesterol   . Involuntary muscle contractions   . Irritable bowel syndrome   . Loss of bladder control   . Low back pain   . Movement disorder   . Nasal inflammation due to allergen   . Obesity   . Osteoarthritis   . Pain in thoracic spine   . Prolapse of vaginal vault after hysterectomy   . 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 Restless leg syndrome   . Sleep apnea   . Sleep disorder   . Spasmodic colon   . Tremor   . Vaginal atrophy     Patient Active Problem List   Diagnosis Date Noted  . Chest pain 08/05/2015    Past Surgical History:  Procedure Laterality Date  . CARDIAC CATHETERIZATION N/A 08/06/2015   Procedure: Left Heart Cath and  Coronary Angiography;  Surgeon: Yolonda Kida, MD;  Location: [facility];  Service: Cardiovascular;  Laterality: N/A;    OB History    No data available       Home Medications    Prior to Admission medications   Medication Sig Start Date End Date Taking? Authorizing Provider  albuterol (PROVENTIL HFA;VENTOLIN HFA) 108 (90 BASE) MCG/ACT inhaler Inhale 2 puffs into the lungs every 6 (six) hours as needed for wheezing or shortness of breath.   Yes [provider]  alosetron (LOTRONEX) 1 MG tablet Take 1 mg by mouth 2 (two) times daily.   Yes [provider]  atorvastatin (LIPITOR) 40 MG tablet Take 40 mg by mouth at bedtime.    Yes [provider]  Azelastine HCl 137 MCG/SPRAY SOLN Place 2 sprays into the nose 2 (two) times daily.   Yes [provider]  baclofen (LIORESAL) 10 MG tablet Take 10 mg by mouth 2 (two) times daily as needed for muscle spasms.   Yes [provider]  budesonide-formoterol (SYMBICORT) 160-4.5 MCG/ACT inhaler Inhale 2 puffs into the lungs 2 (two) times daily.   Yes [provider]  busPIRone (BUSPAR) 15 MG tablet Take  30 mg by mouth 2 (two) times daily.   Yes [provider]  Carboxymethylcell-Hypromellose (GENTEAL) 0.25-0.3 % GEL Place 1 application into both eyes daily as needed.   Yes [provider]  cetirizine (ZYRTEC) 10 MG tablet Take 10 mg by mouth daily.   Yes [provider]  Cholecalciferol (VITAMIN D3) 2000 units TABS Take 2,000 Units by mouth at bedtime.   Yes [provider]  clonazePAM (KLONOPIN) 1 MG tablet Take 1 mg by mouth 3 (three) times daily as needed for anxiety.   Yes [provider]  conjugated estrogens (PREMARIN) vaginal cream Place 1 Applicatorful vaginally 2 (two) times a week.   Yes [provider]  cycloSPORINE (RESTASIS) 0.05 % ophthalmic emulsion Place 1 drop into both eyes every 12 (twelve) hours.    Yes [provider]  diclofenac sodium (VOLTAREN) 1 % GEL Apply 2 g topically 4 (four) times daily as needed (for pain).    Yes [provider]  dicyclomine (BENTYL) 20 MG tablet Take 20 mg by mouth 4 (four) times daily.    Yes [provider]  diphenoxylate-atropine (LOMOTIL) 2.5-0.025 MG tablet Take 1 tablet by mouth 3 (three) times daily as needed for diarrhea or loose stools.   Yes [provider]  esomeprazole (NEXIUM) 40 MG capsule Take 40 mg by mouth daily before breakfast.   Yes [provider]  fluticasone (FLONASE) 50 MCG/ACT nasal spray Place 1 spray into both nostrils 2 (two) times daily.    Yes [provider]  fluticasone-salmeterol (ADVAIR HFA) 115-21 MCG/ACT inhaler Inhale 2 puffs into the lungs 2 (two) times daily.   Yes [provider]  folic acid (FOLVITE) 222 MCG tablet Take 800 mcg by mouth daily.   Yes [provider]  gabapentin (NEURONTIN) 300 MG capsule Take 300-900 mg by mouth at bedtime.   Yes [provider]  guaifenesin (HUMIBID E) 400 MG TABS tablet Take 200 mg by mouth 3 (three) times daily.   Yes [provider]  HYDROcodone-acetaminophen (NORCO) 5-325 MG tablet Take 1 tablet by mouth every 6 (six) hours as needed for moderate pain. 03/20/17  Yes Duanne Guess, PA-C  hydrocortisone (PROCTOSOL HC) 2.5 % rectal cream Place 1 application rectally daily as needed for hemorrhoids or itching.   Yes [provider]  lidocaine (XYLOCAINE) 5 % ointment Apply 1 application topically 2 (two) times daily as needed for mild pain.   Yes [provider]  Magnesium Oxide (MAG-OX 400 PO) Take 800 mg by mouth at bedtime.   Yes [provider]  montelukast (SINGULAIR) 10 MG tablet Take 10 mg by mouth at bedtime.   Yes [provider]  multivitamin-lutein (OCUVITE-LUTEIN) CAPS capsule Take 1 capsule by mouth daily.   Yes [provider]  nystatin (MYCOSTATIN/NYSTOP)  powder Apply topically as needed (to affected areas).   Yes [provider]  nystatin cream (MYCOSTATIN) Apply 1 application topically 2 (two) times daily as needed for dry skin.    Yes [provider]  olopatadine (PATANOL) 0.1 % ophthalmic solution Place 1 drop into both eyes daily.   Yes [provider]  Omega-3 Fatty Acids (FISH OIL) 1000 MG CAPS Take 3,000 mg by mouth at bedtime.   Yes [provider]  pseudoephedrine (SUDAFED) 30 MG tablet Take 30 mg by mouth every 4 (four) hours as needed for congestion.   Yes [provider]  ramelteon (ROZEREM) 8 MG tablet Take 8 mg by mouth  at bedtime.   Yes [provider]  temazepam (RESTORIL) 30 MG capsule Take 30 mg by mouth at bedtime.   Yes [provider]  tiotropium (SPIRIVA) 18 MCG inhalation capsule Place 18 mcg into inhaler and inhale daily.   Yes [provider]  aspirin EC 81 MG EC tablet Take 1 tablet (81 mg total) by mouth daily. Patient not taking: Reported on 11/13/2017 08/05/15   Demetrios Loll, MD  pyridOXINE (VITAMIN B-6) 100 MG tablet Take 200 mg by mouth daily.    [provider]  urea (CARMOL) 40 % ointment Apply 1 application topically every evening.    [provider]    Family History History reviewed. No pertinent family history.  Social History Social History   Tobacco Use  . Smoking status: Current Some Day Smoker    Packs/day: 0.25    Types: Cigarettes  . Smokeless tobacco: Never Used  Substance Use Topics  . Alcohol use: Yes    Alcohol/week: 2.4 oz    Types: 4 Glasses of wine per week  . Drug use: No     Allergies   Bee venom; Betadine [povidone iodine]; Iodides; Morphine and related; Oxycodone; Umeclidinium; Wellbutrin [bupropion]; Codeine; Depakote [divalproex sodium]; Ibuprofen; Naproxen; Nsaids; Requip [ropinirole]; Tape; Augmentin [amoxicillin-pot clavulanate]; Latex; Melatonin; Penicillins; Shellfish allergy; and Sulfa  antibiotics   Review of Systems Review of Systems  All other systems reviewed and are negative.    Physical Exam Updated Vital Signs BP (!) 106/53 (BP Location: Left Arm)   Pulse (!) 51   Temp 97.7 F (36.5 C) (Oral)   Resp 15   Ht 1.626 m (5\' 4" )   Wt 95.3 kg (210 lb)   SpO2 97%   BMI 36.05 kg/m   Physical Exam  Constitutional: She appears well-developed and well-nourished. No distress.  HENT:  Head: Normocephalic and atraumatic.  Right Ear: External ear normal.  Left Ear: External ear normal.  Eyes: Conjunctivae are normal. Right eye exhibits no discharge. Left eye exhibits no discharge. No scleral icterus.  Neck: Neck supple. No tracheal deviation present.  Cardiovascular: Normal rate, regular rhythm and intact distal pulses.  Pulmonary/Chest: Effort normal and breath sounds normal. No stridor. No respiratory distress. She has no wheezes. She has no rales.  Abdominal: Soft. Bowel sounds are normal. She exhibits no distension. There is no tenderness. There is no rebound and no guarding.  Musculoskeletal: She exhibits edema and tenderness.  Edema, tenderness and erythema of the left lower extremity from the calf to the proximal thigh  Neurological: She is alert. She has normal strength. No cranial nerve deficit (no facial droop, extraocular movements intact, no slurred speech) or sensory deficit. She exhibits normal muscle tone. She displays no seizure activity. Coordination normal.  Skin: Skin is warm and dry. No rash noted.  Psychiatric: She has a normal mood and affect.  Nursing note and vitals reviewed.    ED Treatments / Results  Labs (all labs ordered are listed, but only abnormal results are displayed) Labs Reviewed  BRAIN NATRIURETIC PEPTIDE - Abnormal; Notable for the following components:      Result Value   B Natriuretic Peptide 105.6 (*)    All other components within normal limits  CBC WITH DIFFERENTIAL/PLATELET  BASIC METABOLIC PANEL  D-DIMER,  QUANTITATIVE (NOT AT [facility])  PROTIME-INR  APTT  I-STAT TROPONIN, ED     Radiology Dg Chest 2 View  Result Date: 11/13/2017 CLINICAL DATA:  Shortness of breath and lower extremity edema EXAM:  CHEST  2 VIEW COMPARISON:  August 05, 2015 FINDINGS: There is no edema or consolidation. The heart size and pulmonary vascularity are normal. No adenopathy. No bone lesions. IMPRESSION: No edema or consolidation. Electronically Signed   By: Lowella Grip III M.D.   On: 11/13/2017 21:50    Procedures Procedures (including critical care time)  Medications Ordered in ED Medications  hydrocortisone sodium succinate (SOLU-CORTEF) injection 200 mg (not administered)  diphenhydrAMINE (BENADRYL) capsule 50 mg (not administered)    Or  diphenhydrAMINE (BENADRYL) injection 50 mg (not administered)     Initial Impression / Assessment and Plan / ED Course  I have reviewed the triage vital signs and the nursing notes.  Pertinent labs & imaging results that were available during my care of the patient were reviewed by me and considered in my medical decision making (see chart for details).  Clinical Course as of Nov 14 8  Tue Nov 13, 2017  2321 ? Contrast allergy.  Will need to do the protocol  [JK]    Clinical Course User Index [JK] Dorie Rank, MD   Patient presented to the emergency for evaluation of extremity swelling associated with chest pain shortness of breath.  Patient symptoms were concerning for possible DVT.  She is scheduled for a Doppler study tomorrow.  With her complaints of chest pain and her new leg swelling I have ordered a CT Angie of the chest.  Patient has a contrast allergy so we will need to do the contrast allergy prep.  Dr. Leonides Schanz will follow up on the CT scan results.  I discussed the plans with the patient and her daughter.    Final Clinical Impressions(s) / ED Diagnoses  pending   Dorie Rank, MD 11/14/17 0010

## 2017-11-14 ENCOUNTER — Ambulatory Visit (HOSPITAL_COMMUNITY): Admission: RE | Admit: 2017-11-14 | Payer: Medicare Other | Source: Ambulatory Visit

## 2017-11-14 ENCOUNTER — Emergency Department (HOSPITAL_BASED_OUTPATIENT_CLINIC_OR_DEPARTMENT_OTHER)
Admit: 2017-11-14 | Discharge: 2017-11-14 | Disposition: A | Discharge status: Home / Self Care | Payer: Medicare Other | Attending: Emergency Medicine | Admitting: Emergency Medicine

## 2017-11-14 ENCOUNTER — Emergency Department (HOSPITAL_COMMUNITY): Payer: Medicare Other

## 2017-11-14 DIAGNOSIS — M7989 Other specified soft tissue disorders: Secondary | ICD-10-CM | POA: Diagnosis not present

## 2017-11-14 DIAGNOSIS — M79609 Pain in unspecified limb: Secondary | ICD-10-CM

## 2017-11-14 LAB — I-STAT TROPONIN, ED: TROPONIN I, POC: 0.01 ng/mL (ref 0.00–0.08)

## 2017-11-14 MED ORDER — HYDROCODONE-ACETAMINOPHEN 5-325 MG PO TABS: 1.0000 | ORAL_TABLET | ORAL | 0 refills | Status: DC | PRN

## 2017-11-14 MED ORDER — FENTANYL CITRATE (PF) 100 MCG/2ML IJ SOLN
50.0000 ug | INTRAMUSCULAR | Status: DC | PRN
  Administered 2017-11-14: 50 ug via INTRAVENOUS
  Filled 2017-11-14: qty 2

## 2017-11-14 MED ORDER — FENTANYL CITRATE (PF) 100 MCG/2ML IJ SOLN
50.0000 ug | Freq: Once | INTRAMUSCULAR | Status: AC
  Administered 2017-11-14: 50 ug via INTRAVENOUS
  Filled 2017-11-14: qty 2

## 2017-11-14 MED ORDER — ONDANSETRON HCL 4 MG/2ML IJ SOLN
4.0000 mg | Freq: Once | INTRAMUSCULAR | Status: AC
  Administered 2017-11-14: 4 mg via INTRAVENOUS
  Filled 2017-11-14: qty 2

## 2017-11-14 MED ORDER — ONDANSETRON 4 MG PO TBDP: 4.0000 mg | ORAL_TABLET | Freq: Three times a day (TID) | ORAL | 0 refills | Status: DC | PRN

## 2017-11-14 MED ORDER — IOPAMIDOL (ISOVUE-370) INJECTION 76%
INTRAVENOUS | Status: AC
  Administered 2017-11-14: 100 mL
  Filled 2017-11-14: qty 100

## 2017-11-14 NOTE — ED Provider Notes (Signed)
12:00 AM  Assumed care from Dr. Tomi Bamberger.  Patient is a 67 year old female with HLD who presented to the emergency department with 2 weeks of left lower extremity pain and swelling.  Seen by her orthopedic physician who ordered an outpatient ultrasound which is scheduled for tomorrow.  Started complaining of chest pain or shortness of breath and came to the emergency department.  Labs are unremarkable including negative troponin and d-dimer but plan is to obtain CT of her chest given concerning story for DVT.  Patient is allergic to IV contrast and will have to receive CT prep medications prior to IV contrast administration.    EKG Interpretation  Date/Time:  Wednesday November 14 2017 00:44:49 EST Ventricular Rate:  47 PR Interval:    QRS Duration: 83 QT Interval:  422 QTC Calculation: 373 R Axis:   54 Text Interpretation:  Sinus bradycardia Minimal ST elevation, inferior leads No significant change since last tracing Confirmed by Tushar Enns, Cyril Mourning [phone]) on 11/14/2017 12:46:43 AM      5:15 AM  Pt's CT scan shows no PE.  No edema or infiltrate noted.  Second troponin ordered is negative.  EKG shows no ischemic abnormality.  She still complaining of left leg pain that was well controlled with fentanyl.  She needs a venous Doppler of this leg and I did discuss this with Dr. Tomi Bamberger and we both agree that if patient wanted it here in the emergency department we would order it for the morning.  Patient and family would be more comfortable with getting the venous Doppler in the morning here in the emergency department.  She has 2+ pulses bilaterally.  I do not see any sign of cellulitis on exam.  No history of recent injury.  She understands that if she has a DVT she will be started on anticoagulation and follow-up with her primary care physician.  She understands that if her ultrasound is negative that she will follow-up with her orthopedic physician Dr. Adline Peals.  Again nothing on exam that looks like cellulitis,  gout, septic arthritis, arterial obstruction.  No recent injury to suggest fracture.  She has been able to ambulate.  Anticipate that patient will need to be discharged home with pain medication.   7:00 AM  Signed out to Dr. Jeanell Sparrow to follow up on venous doppler results.    Patient and daughter are comfortable with plan and understand that they will be discharged.   I reviewed all nursing notes, vitals, pertinent previous records, EKGs, lab and urine results, imaging (as available).    Sonia Robinson, Delice Bison, DO 11/14/17 [phone]

## 2017-11-14 NOTE — Discharge Instructions (Addendum)
No evidence of blood clot seen on ultrasound.  Please follow up with your doctor.

## 2017-11-14 NOTE — ED Provider Notes (Addendum)
Sign out from Dr. Leonides Schanz.  Patient here overnight with complaints of lower extremity pain sent here for DVT study.  She had CT angios overnight due to some associated chest pain.  She had allergy contrast.  Previous physicians evaluated and did not feel that if doppler is negative, patient may be discharged. Left lower extremity with minimal swelling at ankle.  DP and pt pulses intact.  Skin pink warm and dry.  No s/s infection. No skin disruption.  NO bony deformity.   Venous doppler negative.  Plan discharge   Pattricia Boss, MD 11/14/17 [place]    Pattricia Boss, MD 11/14/17 1044

## 2017-11-14 NOTE — ED Notes (Signed)
Pt returned from Vascular lab at this time

## 2017-11-14 NOTE — ED Notes (Signed)
Patient was able to ambulate to wheelchair (no assist about 4-5 steps) and then was wheeled to bathroom.

## 2017-11-14 NOTE — ED Notes (Signed)
Pt verbalized understanding of DC and f/u instructions.  Unable to sign, signature pad not working.  There are no further needs.

## 2017-11-14 NOTE — Progress Notes (Signed)
LLE venous duplex prelim: negative for DVT. Kyrianna Barletta Eunice, RDMS, RVT  

## 2019-04-28 IMAGING — CT CT ANGIO CHEST
2 of 6 series · 18 of 36 positions shown · IV contrast (Omni 300)
Comparison: None.

CLINICAL DATA: Left leg swelling for 2 weeks. Now right leg
swelling. Shortness of breath. Patient carries a history of contrast
allergy and 4 hour emergent premedication procedure was performed.
No reaction reported.

EXAM:
CT ANGIOGRAPHY CHEST WITH CONTRAST
TECHNIQUE: Multidetector CT imaging of the chest was performed using the
standard protocol during bolus administration of intravenous
contrast. Multiplanar CT image reconstructions and MIPs were
obtained to evaluate the vascular anatomy.
CONTRAST:  100mL BQBUD5-L05 IOPAMIDOL (BQBUD5-L05) INJECTION 76%

[Series 7: pe thins · axial · 0.85mm/px · z∈[+1143,+1349]mm · 17 of 232 slices shown]
[im 13/232  lung]
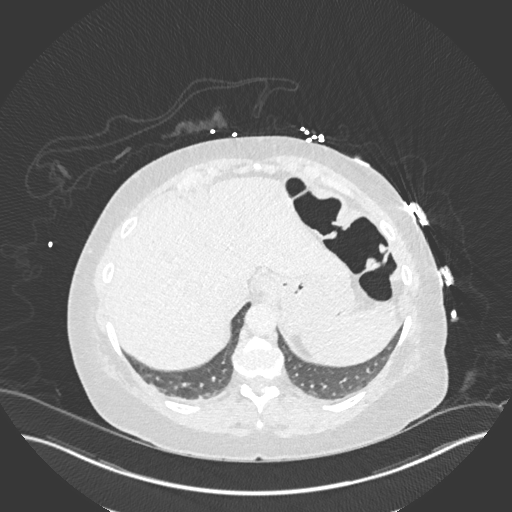
[im 26/232  mediastinal]
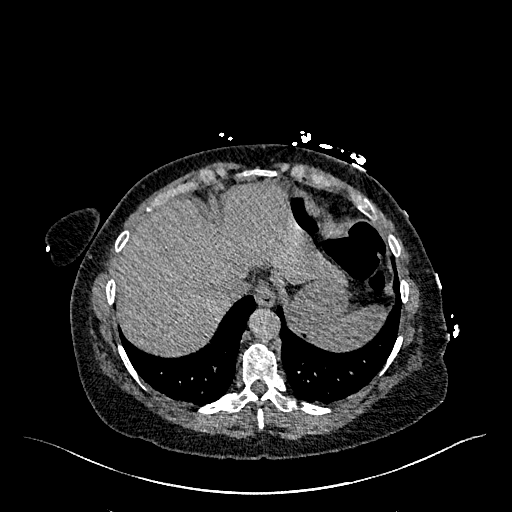
[im 39/232  lung]
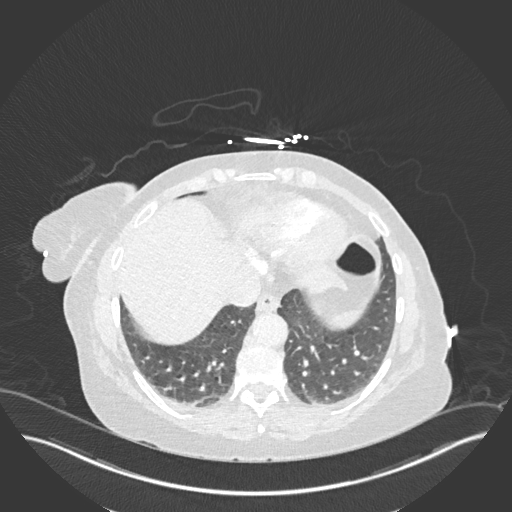
[im 52/232  mediastinal]
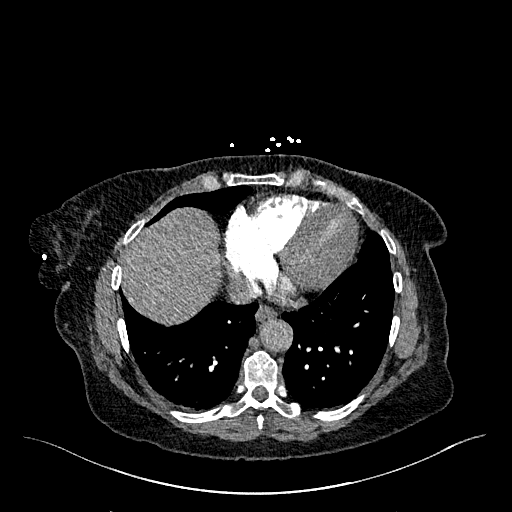
[im 65/232  lung]
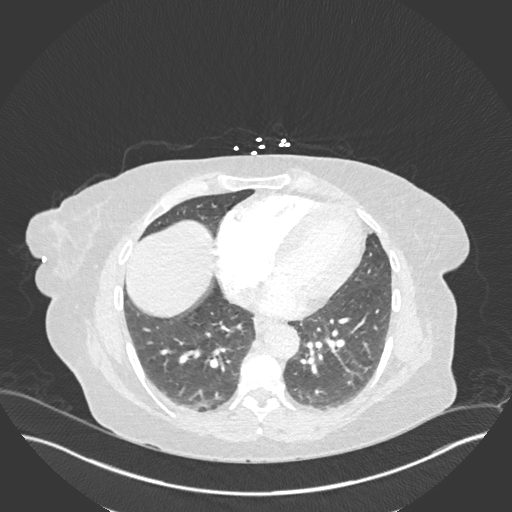
[im 78/232  mediastinal]
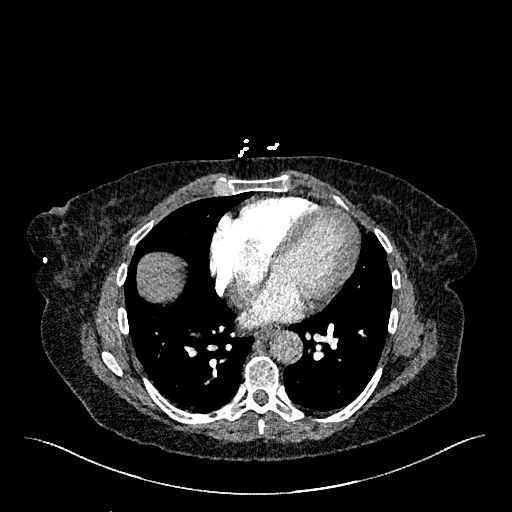
[im 90/232  lung]
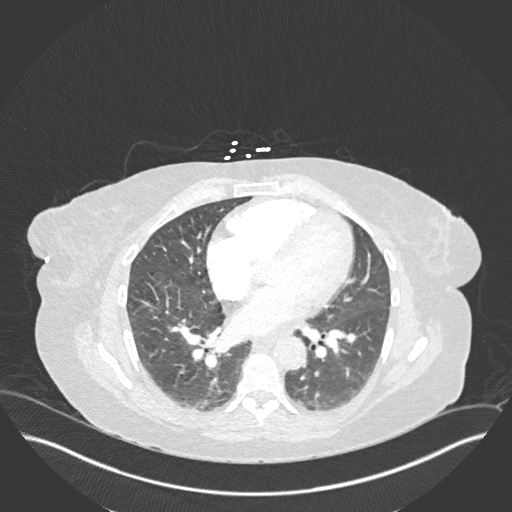
[im 103/232  mediastinal]
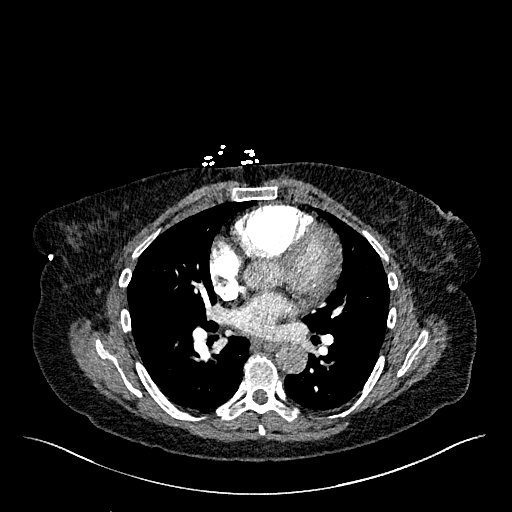
[im 116/232  lung]
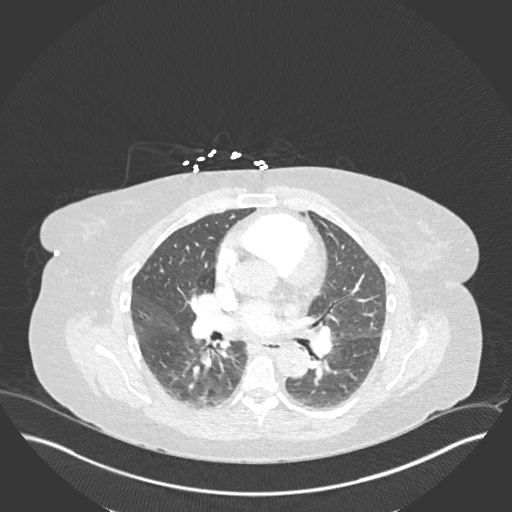
[im 129/232  mediastinal]
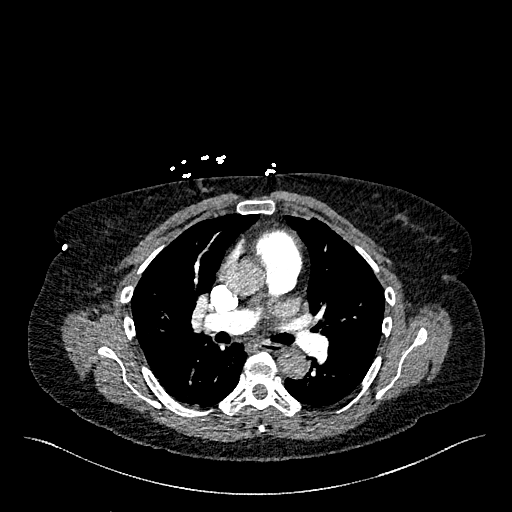
[im 142/232  lung]
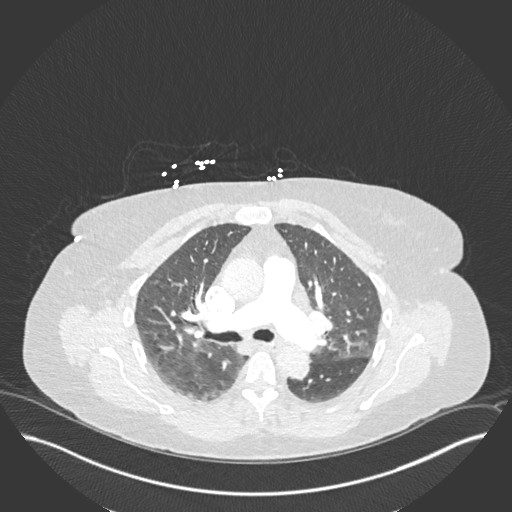
[im 155/232  mediastinal]
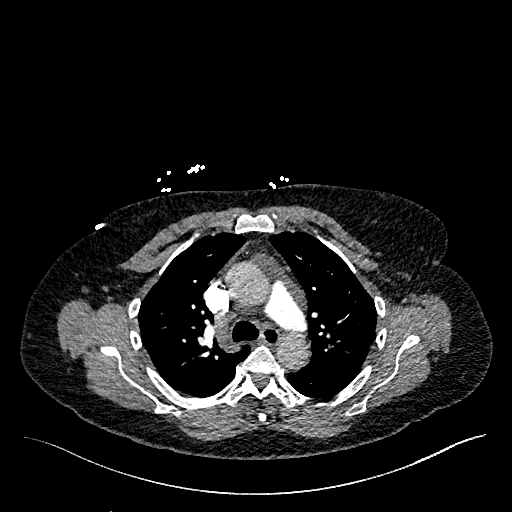
[im 167/232  lung]
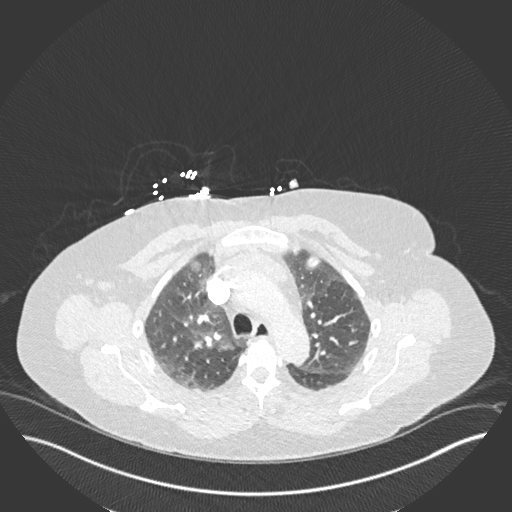
[im 180/232  mediastinal]
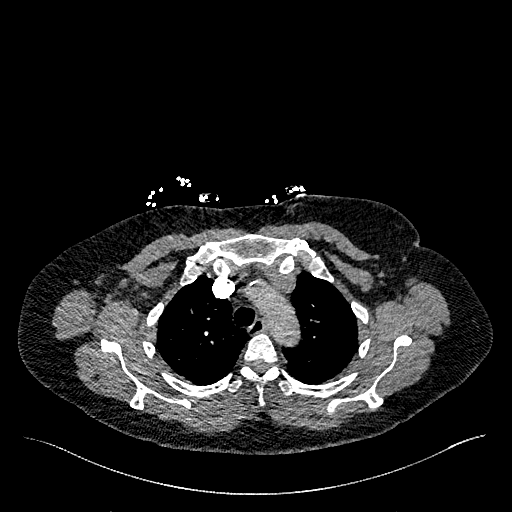
[im 193/232  lung]
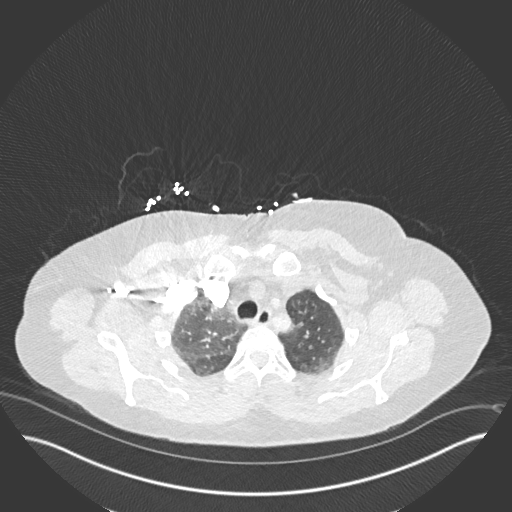
[im 206/232  mediastinal]
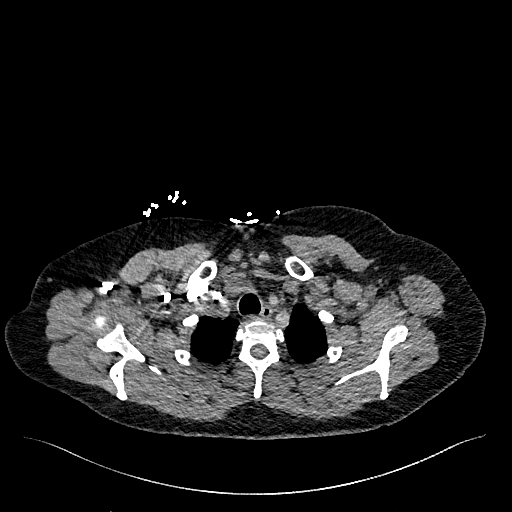
[im 219/232  lung]
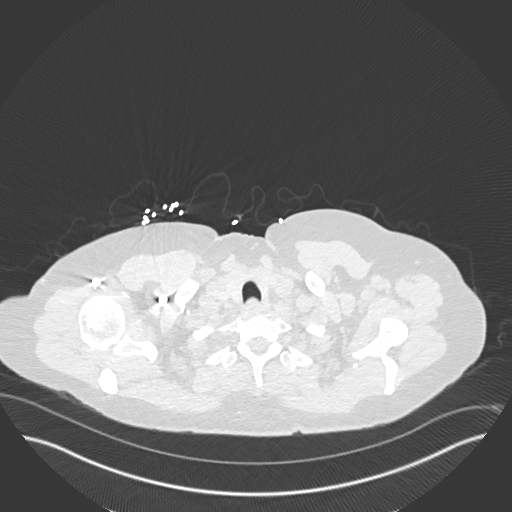

[Series 8: pe 2mm cor · coronal · 0.48mm/px · 1 of 125 slices shown]
[im 63/125  mediastinal]
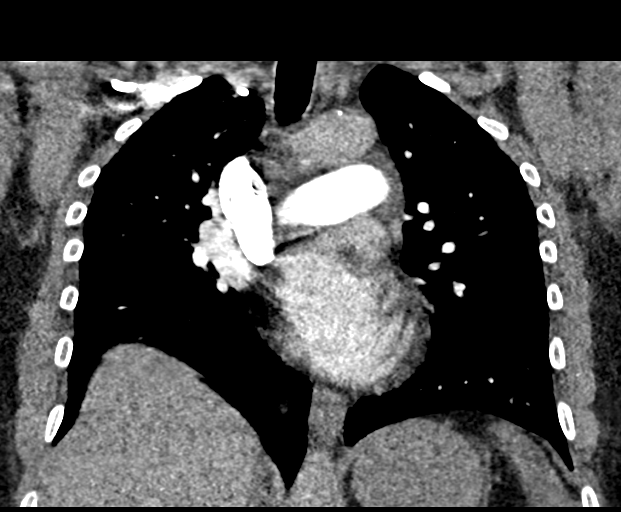

[18 of 36 positions shown; findings below may reference images not displayed]

FINDINGS: Cardiovascular: Good opacification of the central and segmental
pulmonary arteries. No focal filling defects. No evidence of
significant pulmonary embolus. Normal heart size. No pericardial
effusion. Normal caliber thoracic aorta.

Mediastinum/Nodes: No enlarged mediastinal, hilar, or axillary lymph
nodes. Thyroid gland, trachea, and esophagus demonstrate no
significant findings.

Lungs/Pleura: Evaluation is limited due to motion artifact. Slight
mosaic attenuation pattern to the upper lungs may be due to motion
artifact, air trapping, or airways disease. No focal consolidation.
Airways are patent.

Upper Abdomen: No acute abnormality demonstrated on limited upper
abdominal views.

Musculoskeletal: Degenerative changes in the spine. No destructive
bone lesions.

Review of the MIP images confirms the above findings.
IMPRESSION: 1. No evidence of significant pulmonary embolus.
2. Mosaic attenuation pattern to the upper lungs may be due to
motion artifact, air trapping, or airways disease. No focal
consolidation.

## 2021-11-11 DIAGNOSIS — Z79899 Other long term (current) drug therapy: Secondary | ICD-10-CM

## 2022-05-31 ENCOUNTER — Ambulatory Visit (INDEPENDENT_AMBULATORY_CARE_PROVIDER_SITE_OTHER): Payer: Medicare Other | Admitting: Dermatology

## 2022-05-31 ENCOUNTER — Encounter: Payer: Self-pay | Admitting: Dermatology

## 2022-05-31 DIAGNOSIS — Z1283 Encounter for screening for malignant neoplasm of skin: Secondary | ICD-10-CM

## 2022-05-31 DIAGNOSIS — C4491 Basal cell carcinoma of skin, unspecified: Secondary | ICD-10-CM

## 2022-05-31 DIAGNOSIS — C44319 Basal cell carcinoma of skin of other parts of face: Secondary | ICD-10-CM

## 2022-05-31 DIAGNOSIS — L814 Other melanin hyperpigmentation: Secondary | ICD-10-CM | POA: Diagnosis not present

## 2022-05-31 DIAGNOSIS — D492 Neoplasm of unspecified behavior of bone, soft tissue, and skin: Secondary | ICD-10-CM

## 2022-05-31 DIAGNOSIS — L853 Xerosis cutis: Secondary | ICD-10-CM

## 2022-05-31 DIAGNOSIS — L309 Dermatitis, unspecified: Secondary | ICD-10-CM | POA: Diagnosis not present

## 2022-05-31 DIAGNOSIS — D229 Melanocytic nevi, unspecified: Secondary | ICD-10-CM

## 2022-05-31 DIAGNOSIS — D1800 Hemangioma unspecified site: Secondary | ICD-10-CM

## 2022-05-31 DIAGNOSIS — L578 Other skin changes due to chronic exposure to nonionizing radiation: Secondary | ICD-10-CM

## 2022-05-31 DIAGNOSIS — L821 Other seborrheic keratosis: Secondary | ICD-10-CM

## 2022-05-31 HISTORY — DX: Basal cell carcinoma of skin, unspecified: C44.91

## 2022-05-31 MED ORDER — MOMETASONE FUROATE 0.1 % EX CREA: TOPICAL_CREAM | CUTANEOUS | 2 refills | Status: AC

## 2022-05-31 NOTE — Progress Notes (Signed)
New Patient Visit  Subjective  Sonia Robinson is a 71 y.o. female who presents for the following: Annual Exam (Here for skin cancer screening. Full body. No personal history of skin cancer or dysplastic nevi. Areas of concern today) and Eczema (Hands and arms. Dur: years. Has used several OTC moisturizers including AmLactin lotion. Dry, itching).  The patient presents for Total-Body Skin Exam (TBSE) for skin cancer screening and mole check.  The patient has spots, moles and lesions to be evaluated, some may be new or changing and the patient has concerns that these could be cancer.  Daughter with patient.   Review of Systems: No other skin or systemic complaints except as noted in HPI or Assessment and Plan.   Objective  Well appearing patient in no apparent distress; mood and affect are within normal limits.  A full examination was performed including scalp, head, eyes, ears, nose, lips, neck, chest, axillae, abdomen, back, buttocks, bilateral upper extremities, bilateral lower extremities, hands, feet, fingers, toes, fingernails, and toenails. All findings within normal limits unless otherwise noted below.  left lateral cheek 1.1 cm pearly pink papule     Right mid pretibia 5 mm regular tan macule with lighter center     Right Hand - Posterior Hyperpigmented scaly patch with excoriations   Assessment & Plan   Lentigines - Scattered tan macules - Due to sun exposure - Benign-appearing, observe - Recommend daily broad spectrum sunscreen SPF 30+ to sun-exposed areas, reapply every 2 hours as needed. - Call for any changes  Seborrheic Keratoses - Stuck-on, waxy, tan-brown papules and/or plaques  - Benign-appearing - Discussed benign etiology and prognosis. - Observe - Call for any changes  Melanocytic Nevi - Tan-brown and/or pink-flesh-colored symmetric macules and papules - Benign appearing on exam today - Observation - Call clinic for new or changing moles -  Recommend daily use of broad spectrum spf 30+ sunscreen to sun-exposed areas.   Hemangiomas - Red papules - Discussed benign nature - Observe - Call for any changes  Actinic Damage - Chronic condition, secondary to cumulative UV/sun exposure - diffuse scaly erythematous macules with underlying dyspigmentation - Recommend daily broad spectrum sunscreen SPF 30+ to sun-exposed areas, reapply every 2 hours as needed.  - Staying in the shade or wearing long sleeves, sun glasses (UVA+UVB protection) and wide brim hats (4-inch brim around the entire circumference of the hat) are also recommended for sun protection.  - Call for new or changing lesions.  Xerosis - diffuse xerotic patches - recommend gentle, hydrating skin care, multiple samples given - gentle skin care handout given   Skin cancer screening performed today.  Neoplasm of skin left lateral cheek  Skin / nail biopsy Type of biopsy: tangential   Informed consent: discussed and consent obtained   Anesthesia: the lesion was anesthetized in a standard fashion   Anesthesia comment:  Area prepped with alcohol Anesthetic:  1% lidocaine w/ epinephrine 1-100,000 buffered w/ 8.4% NaHCO3 Instrument used: flexible razor blade   Hemostasis achieved with: pressure, aluminum chloride and electrodesiccation   Outcome: patient tolerated procedure well   Post-procedure details: wound care instructions given   Post-procedure details comment:  Ointment and small bandage applied  Specimen 1 - Surgical pathology Differential Diagnosis: R/O BCC  Check Margins: No  Recommend excision if BCC  Lentigo Right mid pretibia  Vs SK  Benign-appearing.  Observation.  Call clinic for new or changing moles.  Recommend daily use of broad spectrum spf 30+ sunscreen to sun-exposed  areas.    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 Condition is  symptomatic / bothersome to patient. Not to goal.   Hand Dermatitis is a chronic type of eczema that can come and go on the hands and fingers.  While there is no cure, the rash and symptoms can be managed with topical prescription medications, and for more severe cases, with systemic medications.  Recommend mild soap and routine use of moisturizing cream after handwashing.  Minimize soap/water exposure when possible.     Start Mometasone cream twice daily up to 2 weeks to hands and arms as needed for eczema.   Topical steroids (such as triamcinolone, fluocinolone, fluocinonide, mometasone, clobetasol, halobetasol, betamethasone, hydrocortisone) can cause thinning and lightening of the skin if they are used for too long in the same area. Your physician has selected the right strength medicine for your problem and area affected on the body. Please use your medication only as directed by your physician to prevent side effects.   Skin care handout given. Aveeno samples given  Recommend starting moisturizer with exfoliant (Urea, Salicylic acid, or Lactic acid) one to two times daily to help smooth rough and bumpy skin.  OTC options include Cetaphil Rough and Bumpy lotion (Urea), Eucerin Roughness Relief lotion or spot treatment cream (Urea), CeraVe SA lotion/cream for Rough and Bumpy skin (Sal Acid), Gold Bond Rough and Bumpy cream (Sal Acid), and AmLactin 12% lotion/cream (Lactic Acid).  If applying in morning, also apply sunscreen to sun-exposed areas, since these exfoliating moisturizers can increase sensitivity to sun.   mometasone (ELOCON) 0.1 % cream - Right Hand - Posterior Apply twice daily to hands and arms up to 2 weeks as needed for eczema   Return in about 1 year (around 06/01/2023) for TBSE.  I, Emelia Salisbury, CMA, am acting as scribe for Brendolyn Patty, MD.  Documentation: I have reviewed the above documentation for accuracy and completeness, and I agree with the above.  Brendolyn Patty MD

## 2022-05-31 NOTE — Patient Instructions (Addendum)
Hands/Arms, eczema:  Start Mometasone cream twice daily up to 2 weeks to hands and arms as needed for eczema. (Prescription)   Topical steroids (such as triamcinolone, fluocinolone, fluocinonide, mometasone, clobetasol, halobetasol, betamethasone, hydrocortisone) can cause thinning and lightening of the skin if they are used for too long in the same area. Your physician has selected the right strength medicine for your problem and area affected on the body. Please use your medication only as directed by your physician to prevent side effects.    Wound Care Instructions  Cleanse wound gently with soap and water once a day then pat dry with clean gauze. Apply a thin coat of Petrolatum (petroleum jelly, "Vaseline") over the wound (unless you have an allergy to this). We recommend that you use a new, sterile tube of Vaseline. Do not pick or remove scabs. Do not remove the yellow or white "healing tissue" from the base of the wound.  Cover the wound with fresh, clean, nonstick gauze and secure with paper tape. You may use Band-Aids in place of gauze and tape if the wound is small enough, but would recommend trimming much of the tape off as there is often too much. Sometimes Band-Aids can irritate the skin.  You should call the office for your biopsy report after 1 week if you have not already been contacted.  If you experience any problems, such as abnormal amounts of bleeding, swelling, significant bruising, significant pain, or evidence of infection, please call the office immediately.  FOR ADULT SURGERY PATIENTS: If you need something for pain relief you may take 1 extra strength Tylenol (acetaminophen) AND 2 Ibuprofen ('200mg'$  each) together every 4 hours as needed for pain. (do not take these if you are allergic to them or if you have a reason you should not take them.) Typically, you may only need pain medication for 1 to 3 days.    Recommend starting moisturizer with exfoliant (Urea, Salicylic  acid, or Lactic acid) one to two times daily to help smooth rough and bumpy skin.  OTC options include Cetaphil Rough and Bumpy lotion (Urea), Eucerin Roughness Relief lotion or spot treatment cream (Urea), CeraVe SA lotion/cream for Rough and Bumpy skin (Sal Acid), Gold Bond Rough and Bumpy cream (Sal Acid), and AmLactin 12% lotion/cream (Lactic Acid).  If applying in morning, also apply sunscreen to sun-exposed areas, since these exfoliating moisturizers can increase sensitivity to sun.    Gentle Skin Care Guide  1. Bathe no more than once a day.  2. Avoid bathing in hot water  3. Use a mild soap like Dove, Vanicream, Cetaphil, CeraVe. Can use Lever 2000 or Cetaphil antibacterial soap  4. Use soap only where you need it. On most days, use it under your arms, between your legs, and on your feet. Let the water rinse other areas unless visibly dirty.  5. When you get out of the bath/shower, use a towel to gently blot your skin dry, don't rub it.  6. While your skin is still a little damp, apply a moisturizing cream such as Vanicream, CeraVe, Cetaphil, Eucerin, Sarna lotion or plain Vaseline Jelly. For hands apply Neutrogena [place] Hand Cream or Excipial Hand Cream.  7. Reapply moisturizer any time you start to itch or feel dry.  8. Sometimes using free and clear laundry detergents can be helpful. Fabric softener sheets should be avoided. Downy Free & Gentle liquid, or any liquid fabric softener that is free of dyes and perfumes, it acceptable to use  9. If  your doctor has given you prescription creams you may apply moisturizers over them   Melanoma ABCDEs  Melanoma is the most dangerous type of skin cancer, and is the leading cause of death from skin disease.  You are more likely to develop melanoma if you: Have light-colored skin, light-colored eyes, or red or blond hair Spend a lot of time in the sun Tan regularly, either outdoors or in a tanning bed Have had blistering sunburns,  especially during childhood Have a close family member who has had a melanoma Have atypical moles or large birthmarks  Early detection of melanoma is key since treatment is typically straightforward and cure rates are extremely high if we catch it early.   The first sign of melanoma is often a change in a mole or a new dark spot.  The ABCDE system is a way of remembering the signs of melanoma.  A for asymmetry:  The two halves do not match. B for border:  The edges of the growth are irregular. C for color:  A mixture of colors are present instead of an even brown color. D for diameter:  Melanomas are usually (but not always) greater than 66m - the size of a pencil eraser. E for evolution:  The spot keeps changing in size, shape, and color.  Please check your skin once per month between visits. You can use a small mirror in front and a large mirror behind you to keep an eye on the back side or your body.   If you see any new or changing lesions before your next follow-up, please call to schedule a visit.  Please continue daily skin protection including broad spectrum sunscreen SPF 30+ to sun-exposed areas, reapplying every 2 hours as needed when you're outdoors.   Staying in the shade or wearing long sleeves, sun glasses (UVA+UVB protection) and wide brim hats (4-inch brim around the entire circumference of the hat) are also recommended for sun protection.     Due to recent changes in healthcare laws, you may see results of your pathology and/or laboratory studies on MyChart before the doctors have had a chance to review them. We understand that in some cases there may be results that are confusing or concerning to you. Please understand that not all results are received at the same time and often the doctors may need to interpret multiple results in order to provide you with the best plan of care or course of treatment. Therefore, we ask that you please give u[place]2 business days to thoroughly  review all your results before contacting the office for clarification. Should we see a critical lab result, you will be contacted sooner.   If You Need Anything After Your Visit  If you have any questions or concerns for your doctor, please call our main line at [phone] press option 4 to reach your doctor's medical assistant. If no one answers, please leave a voicemail as directed and we will return your call as soon as possible. Messages left after 4 pm will be answered the following business day.   You may also send [place] message via MHeber We typically respond to MyChart messages within 1-2 business days.  For prescription refills, please ask your pharmacy to contact our office. Our fax number is [phone]  If you have an urgent issue when the clinic is closed that cannot wait until the next business day, you can page your doctor at the number below.    Please note  that while we do our best to be available for urgent issues outside of office hours, we are not available 24/7.   If you have an urgent issue and are unable to reach [place], you may choose to seek medical care at your doctor's office, retail clinic, urgent care center, or emergency room.  If you have a medical emergency, please immediately call 911 or go to the emergency department.  Pager Numbers  - Dr. Nehemiah Massed: [phone]  - Dr. Laurence Ferrari: [phone]  - Dr. Nicole Kindred: [phone]  In the event of inclement weather, please call our main line at [phone] for an update on the status of any delays or closures.  Dermatology Medication Tips: Please keep the boxes that topical medications come in in order to help keep track of the instructions about where and how to use these. Pharmacies typically print the medication instructions only on the boxes and not directly on the medication tubes.   If your medication is too expensive, please contact our office at [phone] option 4 or send [place] a message through  [place].   We are unable to tell what your co-pay for medications will be in advance as this is different depending on your insurance coverage. However, we may be able to find a substitute medication at lower cost or fill out paperwork to get insurance to cover a needed medication.   If a prior authorization is required to get your medication covered by your insurance company, please allow [place] 1-2 business days to complete this process.  Drug prices often vary depending on where the prescription is filled and some pharmacies may offer cheaper prices.  The website www.goodrx.com contains coupons for medications through different pharmacies. The prices here do not account for what the cost may be with help from insurance (it may be cheaper with your insurance), but the website can give you the price if you did not use any insurance.  - You can print the associated coupon and take it with your prescription to the pharmacy.  - You may also stop by our office during regular business hours and pick up a GoodRx coupon card.  - If you need your prescription sent electronically to a different pharmacy, notify our office through [facility] or by phone at [phone] option 4.     Si Usted Necesita Algo Despus de Su Visita  Tambin puede enviarnos un mensaje a travs de Pharmacist, community. Por lo general respondemos a los mensajes de MyChart en el transcurso de 1 a 2 das hbiles.  Para renovar recetas, por favor pida a su farmacia que se ponga en contacto con nuestra oficina. Harland Dingwall de fax es [place] [phone].  Si tiene un asunto urgente cuando la clnica est cerrada y que no puede esperar hasta el siguiente da hbil, puede llamar/localizar a su doctor(a) al nmero que aparece a continuacin.   Por favor, tenga en cuenta que aunque hacemos todo lo posible para estar disponibles para asuntos urgentes fuera del horario de [place], no estamos disponibles las 24 horas del da, los 7 das de la  [place].   Si tiene un problema urgente y no puede comunicarse con nosotros, puede optar por buscar atencin mdica  en el consultorio de su doctor(a), en una clnica privada, en un centro de atencin urgente o en una sala de emergencias.  Si tiene Engineering geologist, por favor llame inmediatamente al 911 o vaya a la sala de emergencias.  Nmeros de bper  - Dr. Nehemiah Massed: [phone]  -  DraLaurence Ferrari: [phone]  - Dra. Nicole Kindred: [phone]  En caso de inclemencias del [place], por favor llame a Johnsie Kindred principal al [phone] para una actualizacin sobre el [place] de cualquier retraso o cierre.  Consejos para la medicacin en dermatologa: Por favor, guarde las cajas en las que vienen los medicamentos de uso tpico para ayudarle a seguir las instrucciones sobre dnde y cmo usarlos. Las farmacias generalmente imprimen las instrucciones del medicamento slo en las cajas y no directamente en los tubos del [place].   Si su medicamento es muy caro, por favor, pngase en contacto con Zigmund Daniel llamando al [phone] y presione la opcin 4 o envenos un mensaje a travs de Pharmacist, community.   No podemos decirle cul ser su copago por los medicamentos por adelantado ya que esto es diferente dependiendo de la cobertura de su seguro. Sin embargo, es posible que podamos encontrar un medicamento sustituto a Electrical engineer un formulario para que el seguro cubra el medicamento que se considera necesario.   Si se requiere una autorizacin previa para que su compaa de seguros [place] su medicamento, por favor permtanos de 1 a 2 das hbiles para completar este proceso.  Los precios de los medicamentos varan con frecuencia dependiendo del Environmental consultant de dnde se surte la receta y alguna farmacias pueden ofrecer precios ms baratos.  El sitio web www.goodrx.com tiene cupones para medicamentos de Airline pilot. Los precios aqu no tienen en cuenta lo que podra costar con la ayuda del  seguro (puede ser ms barato con su seguro), pero el sitio web puede darle el precio si no utiliz Research scientist (physical sciences).  - Puede imprimir el cupn correspondiente y llevarlo con su receta a la farmacia.  - Tambin puede pasar por nuestra oficina durante el horario de atencin regular y Charity fundraiser una tarjeta de cupones de GoodRx.  - Si necesita que su receta se enve electrnicamente a una farmacia diferente, informe a nuestra oficina a travs de MyChart de [place] o por telfono llamando al [phone] y presione la opcin 4.

## 2022-06-05 ENCOUNTER — Telehealth: Payer: Self-pay

## 2022-06-05 NOTE — Telephone Encounter (Signed)
Advised patient's daughter, Santiago Glad, biopsy was [facility] and surgery scheduled 07/03/2022 at 3:30 PM.

## 2022-06-05 NOTE — Telephone Encounter (Signed)
-----   Message from Brendolyn Patty, MD sent at 06/05/2022  9:26 AM EDT ----- Skin , left lateral cheek BASAL CELL CARCINOMA, SUPERFICIAL AND NODULAR PATTERNS, PERIPHERAL AND DEEP MARGINS INVOLVED  BCC skin cancer, needs excision here in office   - please call patient

## 2022-07-03 ENCOUNTER — Ambulatory Visit (INDEPENDENT_AMBULATORY_CARE_PROVIDER_SITE_OTHER): Payer: Medicare Other | Admitting: Dermatology

## 2022-07-03 ENCOUNTER — Encounter: Payer: Self-pay | Admitting: Dermatology

## 2022-07-03 DIAGNOSIS — C44319 Basal cell carcinoma of skin of other parts of face: Secondary | ICD-10-CM | POA: Diagnosis not present

## 2022-07-03 NOTE — Patient Instructions (Signed)
Wound Care Instructions for After Surgery  On the day following your surgery, you should begin doing daily dressing changes until your sutures are removed: Remove the bandage. Cleanse the wound gently with soap and water.  Make sure you then dry the skin surrounding the wound completely or the tape will not stick to the skin. Do not use cotton balls on the wound. After the wound is clean and dry, apply the ointment (either prescription antibiotic prescribed by your doctor or plain Vaseline if nothing was prescribed) gently with a Q-tip. If you are using a bandaid to cover: Apply a bandaid large enough to cover the entire wound. If you do not have a bandaid large enough to cover the wound OR if you are sensitive to bandaid adhesive: Cut a non-stick pad (such as Telfa) to fit the size of the wound.  Cover the wound with the non-stick pad. If the wound is draining, you may want to add a small amount of gauze on top of the non-stick pad for a little added compression to the area. Use tape to seal the area completely.  For the next 1-2 weeks: Be sure to keep the wound moist with ointment 24/7 to ensure best healing. If you are unable to cover the wound with a bandage to hold the ointment in place, you may need to reapply the ointment several times a day. Do not bend over or lift heavy items to reduce the chance of elevated blood pressure to the wound. Do not participate in particularly strenuous activities.  Below is a list of dressing supplies you might need.  Cotton-tipped applicators - Q-tips Gauze pads (2x2 and/or 4x4) - All-Purpose Sponges New and clean tube of petroleum jelly (Vaseline) OR prescription antibiotic ointment if prescribed Either a bandaid large enough to cover the entire wound OR non-stick dressing material (Telfa) and Tape (Paper or Hypafix)  FOR ADULT SURGERY PATIENTS: If you need something for pain relief, you may take 1 extra strength Tylenol (acetaminophen) and 2  ibuprofen (200 mg) together every 4 hours as needed. (Do not take these medications if you are allergic to them or if you know you cannot take them for any other reason). Typically you may only need pain medication for 1-3 days.   Comments on the Post-Operative Period Slight swelling and redness often appear around the wound. This is normal and will disappear within several days following the surgery. The healing wound will drain a brownish-red-yellow discharge during healing. This is a normal phase of wound healing. As the wound begins to heal, the drainage may increase in amount. Again, this drainage is normal. Notify us if the drainage becomes persistently bloody, excessively swollen, or intensely painful or develops a foul odor or red streaks.  The healing wound will also typically be itchy. This is normal. If you have severe or persistent pain, Notify us if the discomfort is severe or persistent. Avoid alcoholic beverages when taking pain medicine.  In Case of Wound Hemorrhage A wound hemorrhage is when the bandage suddenly becomes soaked with bright red blood and flows profusely. If this happens, sit down or lie down with your head elevated. If the wound has a dressing on it, do not remove the dressing. Apply pressure to the existing gauze. If the wound is not covered, use a gauze pad to apply pressure and continue applying the pressure for 20 minutes without peeking. DO NOT COVER THE WOUND WITH A LARGE TOWEL OR WASH CLOTH. Release your hand from the   wound site but do not remove the dressing. If the bleeding has stopped, gently clean around the wound. Leave the dressing in place for 24 hours if possible. This wait time allows the blood vessels to close off so that you do not spark a new round of bleeding by disrupting the newly clotted blood vessels with an immediate dressing change. If the bleeding does not subside, continue to hold pressure for 40 minutes. If bleeding continues, page your  physician, contact an After Hours clinic or go to the Emergency Room.  Due to recent changes in healthcare laws, you may see results of your pathology and/or laboratory studies on MyChart before the doctors have had a chance to review them. We understand that in some cases there may be results that are confusing or concerning to you. Please understand that not all results are received at the same time and often the doctors may need to interpret multiple results in order to provide you with the best plan of care or course of treatment. Therefore, we ask that you please give us 2 business days to thoroughly review all your results before contacting the office for clarification. Should we see a critical lab result, you will be contacted sooner.   If You Need Anything After Your Visit  If you have any questions or concerns for your doctor, please call our main line at [phone] and press option 4 to reach your doctor's medical assistant. If no one answers, please leave a voicemail as directed and we will return your call as soon as possible. Messages left after 4 pm will be answered the following business day.   You may also send us a message via MyChart. We typically respond to MyChart messages within 1-2 business days.  For prescription refills, please ask your pharmacy to contact our office. Our fax number is [phone].  If you have an urgent issue when the clinic is closed that cannot wait until the next business day, you can page your doctor at the number below.    Please note that while we do our best to be available for urgent issues outside of office hours, we are not available 24/7.   If you have an urgent issue and are unable to reach us, you may choose to seek medical care at your doctor's office, retail clinic, urgent care center, or emergency room.  If you have a medical emergency, please immediately call 911 or go to the emergency department.  Pager Numbers  - Dr. Kowalski:  [phone]  - Dr. Moye: [phone]  - Dr. Stewart: [phone]  In the event of inclement weather, please call our main line at [phone] for an update on the status of any delays or closures.  Dermatology Medication Tips: Please keep the boxes that topical medications come in in order to help keep track of the instructions about where and how to use these. Pharmacies typically print the medication instructions only on the boxes and not directly on the medication tubes.   If your medication is too expensive, please contact our office at [phone] option 4 or send us a message through MyChart.   We are unable to tell what your co-pay for medications will be in advance as this is different depending on your insurance coverage. However, we may be able to find a substitute medication at lower cost or fill out paperwork to get insurance to cover a needed medication.   If a prior authorization is required to get your medication covered by   your insurance company, please allow us 1-2 business days to complete this process.  Drug prices often vary depending on where the prescription is filled and some pharmacies may offer cheaper prices.  The website www.goodrx.com contains coupons for medications through different pharmacies. The prices here do not account for what the cost may be with help from insurance (it may be cheaper with your insurance), but the website can give you the price if you did not use any insurance.  - You can print the associated coupon and take it with your prescription to the pharmacy.  - You may also stop by our office during regular business hours and pick up a GoodRx coupon card.  - If you need your prescription sent electronically to a different pharmacy, notify our office through [place] MyChart or by phone at [phone] option 4.     Si Usted Necesita Algo Despus de Su Visita  Tambin puede enviarnos un mensaje a travs de MyChart. Por lo general  respondemos a los mensajes de MyChart en el transcurso de 1 a 2 das hbiles.  Para renovar recetas, por favor pida a su farmacia que se ponga en contacto con nuestra oficina. Nuestro nmero de fax es el [phone].  Si tiene un asunto urgente cuando la clnica est cerrada y que no puede esperar hasta el siguiente da hbil, puede llamar/localizar a su doctor(a) al nmero que aparece a continuacin.   Por favor, tenga en cuenta que aunque hacemos todo lo posible para estar disponibles para asuntos urgentes fuera del horario de oficina, no estamos disponibles las 24 horas del da, los 7 das de la semana.   Si tiene un problema urgente y no puede comunicarse con nosotros, puede optar por buscar atencin mdica  en el consultorio de su doctor(a), en una clnica privada, en un centro de atencin urgente o en una sala de emergencias.  Si tiene una emergencia mdica, por favor llame inmediatamente al 911 o vaya a la sala de emergencias.  Nmeros de bper  - Dr. Kowalski: [phone]  - Dra. Moye: [phone]  - Dra. Stewart: [phone]  En caso de inclemencias del tiempo, por favor llame a nuestra lnea principal al [phone] para una actualizacin sobre el [place] de cualquier retraso o cierre.  Consejos para la medicacin en dermatologa: Por favor, guarde las cajas en las que vienen los medicamentos de uso tpico para ayudarle a seguir las instrucciones sobre dnde y cmo usarlos. Las farmacias generalmente imprimen las instrucciones del medicamento slo en las cajas y no directamente en los tubos del medicamento.   Si su medicamento es muy caro, por favor, pngase en contacto con nuestra oficina llamando al [phone] y presione la opcin 4 o envenos un mensaje a travs de MyChart.   No podemos decirle cul ser su copago por los medicamentos por adelantado ya que esto es diferente dependiendo de la cobertura de su seguro. Sin embargo, es posible que podamos encontrar un  medicamento sustituto a menor costo o llenar un formulario para que el seguro cubra el medicamento que se considera necesario.   Si se requiere una autorizacin previa para que su compaa de seguros cubra su medicamento, por favor permtanos de 1 a 2 das hbiles para completar este proceso.  Los precios de los medicamentos varan con frecuencia dependiendo del lugar de dnde se surte la receta y alguna farmacias pueden ofrecer precios ms baratos.  El sitio web www.goodrx.com tiene cupones para medicamentos de diferentes farmacias. Los precios aqu no tienen   en cuenta lo que podra costar con la ayuda del seguro (puede ser ms barato con su seguro), pero el sitio web puede darle el precio si no utiliz ningn seguro.  - Puede imprimir el cupn correspondiente y llevarlo con su receta a la farmacia.  - Tambin puede pasar por nuestra oficina durante el horario de atencin regular y recoger una tarjeta de cupones de GoodRx.  - Si necesita que su receta se enve electrnicamente a una farmacia diferente, informe a nuestra oficina a travs de MyChart de [place] o por telfono llamando al [phone] y presione la opcin 4.  

## 2022-07-03 NOTE — Progress Notes (Signed)
   Follow-Up Visit   Subjective  Sonia Robinson is a 71 y.o. female who presents for the following: Basal Cell Carcinoma (L cheek, biopsy proven. Patient presents for excision.).  Patient accompanied by daughter.  The following portions of the chart were reviewed this encounter and updated as appropriate:       Review of Systems:  No other skin or systemic complaints except as noted in HPI or Assessment and Plan.  Objective  Well appearing patient in no apparent distress; mood and affect are within normal limits.  A focused examination was performed including face. Relevant physical exam findings are noted in the Assessment and Plan.  Left Lateral Cheek Pink biopsy site.    Assessment & Plan  Basal cell carcinoma of skin of other parts of face Left Lateral Cheek  Skin excision  Lesion length (cm):  1.2 Lesion width (cm):  0.9 Margin per side (cm):  0.2 Total excision diameter (cm):  1.6 Informed consent: discussed and consent obtained   Timeout: patient name, date of birth, surgical site, and procedure verified   Procedure prep:  Patient was prepped and draped in usual sterile fashion Prep type:  Povidone-iodine Anesthesia: the lesion was anesthetized in a standard fashion   Anesthetic:  1% lidocaine w/ epinephrine 1-100,000 buffered w/ 8.4% NaHCO3 (Total 12cc - 6cc lido w/epi, 6cc bupivicaine) Instrument used: #15 blade   Instrument used comment:  #15c blade Hemostasis achieved with: pressure and electrodesiccation   Outcome: patient tolerated procedure well with no complications   Additional details:  Tag superior 12:00  Skin repair Complexity:  Complex Final length (cm):  4 Informed consent: discussed and consent obtained   Timeout: patient name, date of birth, surgical site, and procedure verified   Reason for type of repair: reduce tension to allow closure, reduce the risk of dehiscence, infection, and necrosis, reduce subcutaneous dead space and avoid a  hematoma, allow closure of the large defect, preserve normal anatomical and functional relationships and enhance both functionality and cosmetic results   Undermining: area extensively undermined   Undermining comment:  1.6 cm Subcutaneous layers (deep stitches):  Suture size:  5-0 Suture type: Vicryl (polyglactin 910)   Stitches:  Buried vertical mattress Fine/surface layer approximation (top stitches):  Suture size:  5-0 Suture type: nylon   Stitches: simple interrupted   Suture removal (days):  7 Hemostasis achieved with: suture and pressure Outcome: patient tolerated procedure well with no complications   Post-procedure details: sterile dressing applied and wound care instructions given   Dressing type: pressure dressing (mupirocin)    Specimen 1 - Surgical pathology Differential Diagnosis: BCC Check Margins: Yes Pink biopsy site. [id number] Tag superior 12:00  Biopsy proven   Return in about 1 week (around 07/10/2022) for suture removal.  I, Jamesetta Orleans, CMA, am acting as scribe for Brendolyn Patty, MD .  Documentation: I have reviewed the above documentation for accuracy and completeness, and I agree with the above.  Brendolyn Patty MD

## 2022-07-04 ENCOUNTER — Telehealth: Payer: Self-pay

## 2022-07-04 NOTE — Telephone Encounter (Signed)
Talked to patient and she is having some pain from yesterday's surgery. She is going to start Extra Strength Tylenol and will let us know if pain is not getting any better or worsening. Patient not able to tolerate other pain meds.

## 2022-07-10 ENCOUNTER — Ambulatory Visit (INDEPENDENT_AMBULATORY_CARE_PROVIDER_SITE_OTHER): Payer: Medicare Other | Admitting: Dermatology

## 2022-07-10 DIAGNOSIS — C44319 Basal cell carcinoma of skin of other parts of face: Secondary | ICD-10-CM

## 2022-07-10 DIAGNOSIS — L233 Allergic contact dermatitis due to drugs in contact with skin: Secondary | ICD-10-CM

## 2022-07-10 DIAGNOSIS — Z4802 Encounter for removal of sutures: Secondary | ICD-10-CM

## 2022-07-10 NOTE — Patient Instructions (Addendum)
Eucrisa Ointment (samples given) - Apply once or twice daily to left cheek for itch until improved. Avoid using on steri-strips.    After Suture Removal  If your medical team has placed Steri-Strips (white or tan adhesive strips covering the surgical site to provide extra support): Keep the area dry until they fall off.  Do not peel them off. Just let them fall off on their own.  If the edges peel up, you can trim them with scissors.   If your team has not placed Steri-Strips: Wash the area daily with soap and water. Then coat the incision site with plain Vaseline and cover with a bandage. Do this daily for 5 days after the sutures are removed. After that, no additional wound care is generally needed.  However, if you would like to help fade the scar, you can apply a silicone scar cream, gel or sheet every night. The scar will remodel for one year after the procedure. If a skin cancer was removed, be sure to keep your appointment with your dermatologist for follow-up and let your dermatology team know if you have any new or changing spots between visits.    Please call our office at [phone] for any questions or concerns.  Recommend Serica moisturizing scar formula cream every night or [facility] brand or Mederma silicone scar sheet every night for the first year after a scar appears to help with scar remodeling if desired. Scars remodel on their own for a full year and will gradually improve in appearance over time.   Due to recent changes in healthcare laws, you may see results of your pathology and/or laboratory studies on MyChart before the doctors have had a chance to review them. We understand that in some cases there may be results that are confusing or concerning to you. Please understand that not all results are received at the same time and often the doctors may need to interpret multiple results in order to provide you with the best plan of care or course of treatment. Therefore,  we ask that you please give [place] 2 business days to thoroughly review all your results before contacting the office for clarification. Should we see a critical lab result, you will be contacted sooner.   If You Need Anything After Your Visit  If you have any questions or concerns for your doctor, please call our main line at [phone] and press option 4 to reach your doctor's medical assistant. If no one answers, please leave a voicemail as directed and we will return your call as soon as possible. Messages left after 4 pm will be answered the following business day.   You may also send [place] a message via [place]. We typically respond to MyChart messages within 1-2 business days.  For prescription refills, please ask your pharmacy to contact our office. Our fax number is [phone].  If you have an urgent issue when the clinic is closed that cannot wait until the next business day, you can page your doctor at the number below.    Please note that while we do our best to be available for urgent issues outside of office hours, we are not available 24/7.   If you have an urgent issue and are unable to reach [place], you may choose to seek medical care at your doctor's office, retail clinic, urgent care center, or emergency room.  If you have a medical emergency, please immediately call 911 or go to the emergency department.  Pager Numbers  -  Dr. Nehemiah Massed: [phone]  - Dr. Laurence Ferrari: [phone]  - Dr. Nicole Kindred: [phone]  In the event of inclement weather, please call our main line at [phone] for an update on the status of any delays or closures.  Dermatology Medication Tips: Please keep the boxes that topical medications come in in order to help keep track of the instructions about where and how to use these. Pharmacies typically print the medication instructions only on the boxes and not directly on the medication tubes.   If your medication is too expensive, please contact our  office at [phone] option 4 or send [place] a message through [place].   We are unable to tell what your co-pay for medications will be in advance as this is different depending on your insurance coverage. However, we may be able to find a substitute medication at lower cost or fill out paperwork to get insurance to cover a needed medication.   If a prior authorization is required to get your medication covered by your insurance company, please allow [place] 1-2 business days to complete this process.  Drug prices often vary depending on where the prescription is filled and some pharmacies may offer cheaper prices.  The website www.goodrx.com contains coupons for medications through different pharmacies. The prices here do not account for what the cost may be with help from insurance (it may be cheaper with your insurance), but the website can give you the price if you did not use any insurance.  - You can print the associated coupon and take it with your prescription to the pharmacy.  - You may also stop by our office during regular business hours and pick up a GoodRx coupon card.  - If you need your prescription sent electronically to a different pharmacy, notify our office through [facility] or by phone at [phone] option 4.     Si Usted Necesita Algo Despus de Su Visita  Tambin puede enviarnos un mensaje a travs de Pharmacist, community. Por lo general respondemos a los mensajes de MyChart en el transcurso de 1 a 2 das hbiles.  Para renovar recetas, por favor pida a su farmacia que se ponga en contacto con nuestra oficina. Harland Dingwall de fax es [place] [phone].  Si tiene un asunto urgente cuando la clnica est cerrada y que no puede esperar hasta el siguiente da hbil, puede llamar/localizar a su doctor(a) al nmero que aparece a continuacin.   Por favor, tenga en cuenta que aunque hacemos todo lo posible para estar disponibles para asuntos urgentes fuera del horario de [place], no  estamos disponibles las 24 horas del da, los 7 das de la [employer].   Si tiene un problema urgente y no puede comunicarse con nosotros, puede optar por buscar atencin mdica  en el consultorio de su doctor(a), en una clnica privada, en un centro de atencin urgente o en una sala de emergencias.  Si tiene Engineering geologist, por favor llame inmediatamente al 911 o vaya a la sala de emergencias.  Nmeros de bper  - Dr. Nehemiah Massed: [phone]  - Dra. Moye: [phone]  - Dra. Nicole Kindred: [phone]  En caso de inclemencias del [place], por favor llame a Johnsie Kindred principal al [phone] para una actualizacin sobre el [place] de cualquier retraso o cierre.  Consejos para la medicacin en dermatologa: Por favor, guarde las cajas en las que vienen los medicamentos de uso tpico para ayudarle a seguir las instrucciones sobre dnde y cmo usarlos. [place] instrucciones del medicamento  slo en las cajas y no directamente en los tubos del medicamento.   Si su medicamento es muy caro, por favor, pngase en contacto con Zigmund Daniel llamando al [phone] y presione la opcin 4 o envenos un mensaje a travs de Pharmacist, community.   No podemos decirle cul ser su copago por los medicamentos por adelantado ya que esto es diferente dependiendo de la cobertura de su seguro. Sin embargo, es posible que podamos encontrar un medicamento sustituto a Electrical engineer un formulario para que el seguro cubra el medicamento que se considera necesario.   Si se requiere una autorizacin previa para que su compaa de seguros [place] su medicamento, por favor permtanos de 1 a 2 das hbiles para completar este proceso.  Los precios de los medicamentos varan con frecuencia dependiendo del Environmental consultant de dnde se surte la receta y alguna farmacias pueden ofrecer precios ms baratos.  El sitio web www.goodrx.com tiene cupones para medicamentos de Airline pilot. Los precios  aqu no tienen en cuenta lo que podra costar con la ayuda del seguro (puede ser ms barato con su seguro), pero el sitio web puede darle el precio si no utiliz Research scientist (physical sciences).  - Puede imprimir el cupn correspondiente y llevarlo con su receta a la farmacia.  - Tambin puede pasar por nuestra oficina durante el horario de atencin regular y Charity fundraiser una tarjeta de cupones de GoodRx.  - Si necesita que su receta se enve electrnicamente a una farmacia diferente, informe a nuestra oficina a travs de MyChart de [place] o por telfono llamando al [phone] y presione la opcin 4.

## 2022-07-10 NOTE — Progress Notes (Signed)
   Follow-Up Visit   Subjective  Sonia Robinson is a 71 y.o. female who presents for the following: Post op (BCC, left lateral cheek, margins free. ).   The following portions of the chart were reviewed this encounter and updated as appropriate:       Review of Systems:  No other skin or systemic complaints except as noted in HPI or Assessment and Plan.  Objective  Well appearing patient in no apparent distress; mood and affect are within normal limits.  A focused examination was performed including face. Relevant physical exam findings are noted in the Assessment and Plan.  Left Lateral Cheek Healing excision site is clean, dry and intact   left cheek Erythema surrounding excision site.     Assessment & Plan  Basal cell carcinoma (BCC) of skin of other part of face Left Lateral Cheek  Healing well Wound cleansed, sutures removed, wound cleansed and steri strips applied. Discussed pathology results. Margins free   Allergic contact dermatitis due to drugs in contact with skin left cheek  Secondary to Polysporin/Neosporin.   Avoid using Poly/Neosporin.  Samples of Eucrisa Ointment x 2 Apply BID as needed for itch on cheek. Avoid using on steri-strips.  Lot No. TNDA Exp 08/2024   Return in about 6 months (around 01/08/2023) for recheck [place].  IJamesetta Orleans, CMA, am acting as scribe for Brendolyn Patty, MD .  Documentation: I have reviewed the above documentation for accuracy and completeness, and I agree with the above.  Brendolyn Patty MD

## 2022-10-12 ENCOUNTER — Ambulatory Visit
Admission: EM | Admit: 2022-10-12 | Discharge: 2022-10-12 | Disposition: A | Discharge status: Home / Self Care | Payer: Medicare Other | Attending: Emergency Medicine | Admitting: Emergency Medicine

## 2022-10-12 ENCOUNTER — Ambulatory Visit (INDEPENDENT_AMBULATORY_CARE_PROVIDER_SITE_OTHER): Payer: Medicare Other

## 2022-10-12 DIAGNOSIS — J45901 Unspecified asthma with (acute) exacerbation: Secondary | ICD-10-CM

## 2022-10-12 DIAGNOSIS — R062 Wheezing: Secondary | ICD-10-CM | POA: Insufficient documentation

## 2022-10-12 DIAGNOSIS — R051 Acute cough: Secondary | ICD-10-CM

## 2022-10-12 DIAGNOSIS — Z1152 Encounter for screening for COVID-19: Secondary | ICD-10-CM | POA: Diagnosis not present

## 2022-10-12 MED ORDER — PREDNISONE 10 MG PO TABS: 40.0000 mg | ORAL_TABLET | Freq: Every day | ORAL | 0 refills | Status: AC

## 2022-10-12 NOTE — ED Provider Notes (Signed)
Sonia Robinson    CSN: [id number] Arrival date & time: 10/12/22  1525      History   Chief Complaint Chief Complaint  Patient presents with   URI    HPI Sonia Robinson is a 71 y.o. female.  Accompanied by her daughter, patient presents with fatigue, loss of taste and smell, congestion, cough, wheezing x 5-6 days.  She is currently on Levaquin for sinus infection.   No fever, chest pain, shortness of breath, vomiting, diarrhea, or other symptoms.  Patient's history includes chronic rhinitis; followed by [facility]; last seen on 09/29/2022; She is using her albuterol inhaler and taking a muscle relaxer.  Her medical history includes asthma, sleep apnea, restless leg syndrome, Raynauds, chronic pain syndrome.    The history is provided by the patient, a relative and medical records.    Past Medical History:  Diagnosis Date   Asthma    Basal cell carcinoma 05/31/2022   Left lateral cheek, exc 07/03/2022   Cataract    Chronic pain syndrome    Degenerative joint disease of ankle and foot    Disorder of sacrum    Fallen bladder    Hemorrhoids    High cholesterol    Involuntary muscle contractions    Irritable bowel syndrome    Loss of bladder control    Low back pain    Movement disorder    Nasal inflammation due to allergen    Obesity    Osteoarthritis    Pain in thoracic spine    Prolapse of vaginal vault after hysterectomy    Raynaud's disease with gangrene (HCC)    Restless leg syndrome    Sleep apnea    Sleep disorder    Spasmodic colon    Tremor    Vaginal atrophy     Patient Active Problem List   Diagnosis Date Noted   Chest pain 08/05/2015    Past Surgical History:  Procedure Laterality Date   CARDIAC CATHETERIZATION N/A 08/06/2015   Procedure: Left Heart Cath and Coronary Angiography;  Surgeon: Yolonda Kida, MD;  Location: [place] [facility];  Service: Cardiovascular;  Laterality: N/A;    OB History   No obstetric history on file.       Home Medications    Prior to Admission medications   Medication Sig Start Date End Date Taking? Authorizing Provider  azelastine (ASTELIN) 0.1 % nasal spray 2 sprays into each nostril Two (2) times a day. Use in each nostril as directed 11/08/21  Yes [provider]  predniSONE (DELTASONE) 10 MG tablet Take 4 tablets (40 mg total) by mouth daily for 5 days. 10/12/22 10/17/22 Yes Sharion Balloon, NP  albuterol (PROVENTIL HFA;VENTOLIN HFA) 108 (90 BASE) MCG/ACT inhaler Inhale 2 puffs into the lungs every 6 (six) hours as needed for wheezing or shortness of breath.    [provider]  alosetron (LOTRONEX) 1 MG tablet Take 1 mg by mouth 2 (two) times daily.    [provider]  aspirin EC 81 MG EC tablet Take 1 tablet (81 mg total) by mouth daily. Patient not taking: Reported on 11/13/2017 08/05/15   Demetrios Loll, MD  atorvastatin (LIPITOR) 40 MG tablet Take 40 mg by mouth at bedtime.     [provider]  Azelastine HCl 137 MCG/SPRAY SOLN Place 2 sprays into the nose 2 (two) times daily.    [provider]  baclofen (LIORESAL) 10 MG tablet Take 10 mg by mouth 2 (two)  times daily as needed for muscle spasms.    [provider]  budesonide-formoterol (SYMBICORT) 160-4.5 MCG/ACT inhaler Inhale 2 puffs into the lungs 2 (two) times daily.    [provider]  busPIRone (BUSPAR) 15 MG tablet Take 30 mg by mouth 2 (two) times daily. Patient not taking: Reported on 05/31/2022    [provider]  Carboxymethylcell-Hypromellose (GENTEAL) 0.25-0.3 % GEL Place 1 application into both eyes daily as needed.    [provider]  cetirizine (ZYRTEC) 10 MG tablet Take 10 mg by mouth daily. Patient not taking: Reported on 05/31/2022    [provider]  Cholecalciferol (VITAMIN D3) 2000 units TABS Take 2,000 Units by mouth at bedtime.    [provider]  clonazePAM (KLONOPIN) 1 MG tablet Take 1 mg by mouth 3 (three) times  daily as needed for anxiety.    [provider]  conjugated estrogens (PREMARIN) vaginal cream Place 1 Applicatorful vaginally 2 (two) times a week. Patient not taking: Reported on 05/31/2022    [provider]  cycloSPORINE (RESTASIS) 0.05 % ophthalmic emulsion Place 1 drop into both eyes every 12 (twelve) hours.     [provider]  diclofenac sodium (VOLTAREN) 1 % GEL Apply 2 g topically 4 (four) times daily as needed (for pain).     [provider]  dicyclomine (BENTYL) 20 MG tablet Take 20 mg by mouth 4 (four) times daily.     [provider]  diphenoxylate-atropine (LOMOTIL) 2.5-0.025 MG tablet Take 1 tablet by mouth 3 (three) times daily as needed for diarrhea or loose stools.    [provider]  esomeprazole (NEXIUM) 40 MG capsule Take 40 mg by mouth daily before breakfast.    [provider]  FLUoxetine (PROZAC) 20 MG capsule Take 60 mg by mouth every morning.    [provider]  fluticasone (FLONASE) 50 MCG/ACT nasal spray Place 1 spray into both nostrils 2 (two) times daily.  Patient not taking: Reported on 05/31/2022    [provider]  fluticasone-salmeterol (ADVAIR HFA) 115-21 MCG/ACT inhaler Inhale 2 puffs into the lungs 2 (two) times daily.    [provider]  folic acid (FOLVITE) 222 MCG tablet Take 800 mcg by mouth daily.    [provider]  gabapentin (NEURONTIN) 300 MG capsule Take 300-900 mg by mouth at bedtime. Patient not taking: Reported on 05/31/2022    [provider]  guaifenesin (HUMIBID E) 400 MG TABS tablet Take 200 mg by mouth 3 (three) times daily.    [provider]  HYDROcodone-acetaminophen (NORCO/VICODIN) 5-325 MG tablet Take 1 tablet by mouth every 4 (four) hours as needed. 11/14/17   Ward, Delice Bison, DO  hydrocortisone (PROCTOSOL HC) 2.5 % rectal cream Place 1 application rectally daily as needed for hemorrhoids or itching.    [provider]   levofloxacin (LEVAQUIN) 500 MG tablet Take by mouth. 10/02/22 10/12/22  [provider]  lidocaine (XYLOCAINE) 5 % ointment Apply 1 application topically 2 (two) times daily as needed for mild pain.    [provider]  LINZESS 290 MCG CAPS capsule Take 290 mcg by mouth daily.    [provider]  lisinopril (ZESTRIL) 30 MG tablet Take 30 mg by mouth daily.    [provider]  Magnesium Oxide (MAG-OX 400 PO) Take 800 mg by mouth at bedtime.    [provider]  mometasone (ELOCON) 0.1 % cream Apply twice daily to hands and arms up to 2  weeks as needed for eczema 05/31/22   Brendolyn Patty, MD  montelukast (SINGULAIR) 10 MG tablet Take 10 mg by mouth at bedtime.    [provider]  multivitamin-lutein (OCUVITE-LUTEIN) CAPS capsule Take 1 capsule by mouth daily.    [provider]  nystatin (MYCOSTATIN/NYSTOP) powder Apply topically as needed (to affected areas). Patient not taking: Reported on 05/31/2022    [provider]  nystatin cream (MYCOSTATIN) Apply 1 application topically 2 (two) times daily as needed for dry skin.  Patient not taking: Reported on 05/31/2022    [provider]  olopatadine (PATANOL) 0.1 % ophthalmic solution Place 1 drop into both eyes daily.    [provider]  Omega-3 Fatty Acids (FISH OIL) 1000 MG CAPS Take 3,000 mg by mouth at bedtime.    [provider]  ondansetron (ZOFRAN ODT) 4 MG disintegrating tablet Take 1 tablet (4 mg total) by mouth every 8 (eight) hours as needed for nausea or vomiting. Patient not taking: Reported on 05/31/2022 11/14/17   Ward, Delice Bison, DO  pseudoephedrine (SUDAFED) 30 MG tablet Take 30 mg by mouth every 4 (four) hours as needed for congestion.    [provider]  pyridOXINE (VITAMIN B-6) 100 MG tablet Take 200 mg by mouth daily.    [provider]  ramelteon (ROZEREM) 8 MG tablet Take 8 mg by mouth at bedtime.    [provider]  temazepam (RESTORIL) 30 MG capsule Take 30 mg by mouth at bedtime.    [provider]  tiotropium (SPIRIVA) 18 MCG inhalation capsule Place 18 mcg into inhaler and inhale daily.    [provider]  urea (CARMOL) 40 % ointment Apply 1 application topically every evening. Patient not taking: Reported on 05/31/2022    [provider]    Family History History reviewed. No pertinent family history.  Social History Social History   Tobacco Use   Smoking status: Former    Packs/day: 0.25    Types: Cigarettes   Smokeless tobacco: Never  Substance Use Topics   Alcohol use: Yes    Alcohol/week: 4.0 standard drinks of alcohol    Types: 4 Glasses of wine per week   Drug use: No     Allergies   Bee venom, Betadine [povidone iodine], Iodides, Morphine and related, Oxycodone, Umeclidinium, Wellbutrin [bupropion], Codeine, Depakote [divalproex sodium], Ibuprofen, Naproxen, Nsaids, Requip [ropinirole], Tape, Augmentin [amoxicillin-pot clavulanate], Latex, Melatonin, Penicillins, Shellfish allergy, and Sulfa antibiotics   Review of Systems Review of Systems  Constitutional:  Positive for chills and fatigue. Negative for fever.  HENT:  Negative for ear pain and sore throat.   Respiratory:  Positive for cough and wheezing. Negative for shortness of breath.   Cardiovascular:  Negative for chest pain and palpitations.  Gastrointestinal:  Negative for diarrhea and vomiting.  Skin:  Negative for color change and rash.  All other systems reviewed and are negative.    Physical Exam Triage Vital Signs ED Triage Vitals [10/12/22 1545]  Enc Vitals Group     BP      Pulse Rate 86     Resp 18     Temp 97.8 F (36.6 C)     Temp src      SpO2 95 %     Weight      Height      Head Circumference      Peak Flow      Pain Score      Pain Loc  Pain Edu?      Excl. in [place]?    No data found.  Updated Vital Signs BP 98/66   Pulse 86   Temp 97.8 F (36.6 C)    Resp 18   Ht '5\' 4"'$  (1.626 m)   Wt 183 lb (83 kg)   SpO2 95%   BMI 31.41 kg/m   Visual Acuity Right Eye Distance:   Left Eye Distance:   Bilateral Distance:    Right Eye Near:   Left Eye Near:    Bilateral Near:     Physical Exam Vitals and nursing note reviewed.  Constitutional:      General: She is not in acute distress.    Appearance: She is well-developed. She is ill-appearing.  HENT:     Right Ear: Tympanic membrane normal.     Left Ear: Tympanic membrane normal.     Nose: Nose normal.     Mouth/Throat:     Pharynx: Oropharynx is clear.  Cardiovascular:     Rate and Rhythm: Normal rate and regular rhythm.     Heart sounds: Normal heart sounds.  Pulmonary:     Effort: Pulmonary effort is normal. No respiratory distress.     Breath sounds: Wheezing and rhonchi present.  Musculoskeletal:     Cervical back: Neck supple.  Skin:    General: Skin is warm and dry.  Neurological:     Mental Status: She is alert.  Psychiatric:        Mood and Affect: Mood normal.        Behavior: Behavior normal.      UC Treatments / Results  Labs (all labs ordered are listed, but only abnormal results are displayed) Labs Reviewed  SARS CORONAVIRUS 2 (TAT 6-24 HRS)    EKG   Radiology DG Chest 2 View  Result Date: 10/12/2022 CLINICAL DATA:  Productive cough, body aches, congestion, and wheezing. EXAM: CHEST - 2 VIEW COMPARISON:  Chest x-ray dated November 13, 2017. FINDINGS: The heart size and mediastinal contours are within normal limits. Both lungs are clear. The visualized skeletal structures are unremarkable. IMPRESSION: No active cardiopulmonary disease. Electronically Signed   By: Titus Dubin M.D.   On: 10/12/2022 16:44    Procedures Procedures (including critical care time)  Medications Ordered in UC Medications - No data to display  Initial Impression / Assessment and Plan / UC Course  I have reviewed the triage vital signs and the nursing notes.  Pertinent  labs & imaging results that were available during my care of the patient were reviewed by me and considered in my medical decision making (see chart for details).    Cough, wheezing, asthma exacerbation.  Patient is currently on Levaquin and has been using her albuterol.  Treating today with prednisone.  COVID pending.  Discussed symptomatic treatment including Tylenol, rest, hydration.  Instructed patient to follow up with her PCP.  ED precautions discussed.  Education provided on asthma and cough.  She agrees to plan of care.   Final Clinical Impressions(s) / UC Diagnoses   Final diagnoses:  Acute cough  Asthma with acute exacerbation, unspecified asthma severity, unspecified whether persistent  Wheezing     Discharge Instructions      Take the prednisone as directed.  Continue your current medications.  Go to the emergency department if you have shortness of breath or other concerning symptoms.  Follow up with your primary care provider.        ED Prescriptions  Medication Sig Dispense Auth. Provider   predniSONE (DELTASONE) 10 MG tablet Take 4 tablets (40 mg total) by mouth daily for 5 days. 20 tablet Sharion Balloon, NP      I have reviewed the PDMP during this encounter.   Sharion Balloon, NP 10/12/22 [phone]

## 2022-10-12 NOTE — Discharge Instructions (Addendum)
Take the prednisone as directed.  Continue your current medications.  Go to the emergency department if you have shortness of breath or other concerning symptoms.  Follow up with your primary care provider.

## 2022-10-12 NOTE — ED Triage Notes (Signed)
Patient to Urgent Care with complaints of productive cough (green mucus), congestion, wheezing, body aches, fatigue and no smell or taste. Chills but no known fevers. Symptoms started Saturday evening.   Also reports that she has a sore inside of her mouth.   Using her inhalers but can still hear herself wheezing. Has also taking a muscle relaxer d/t rib pain from coughing. Also taking robitussin DM.  Currently on levaquin for a sinus infection.

## 2022-10-13 ENCOUNTER — Ambulatory Visit: Payer: Self-pay

## 2022-10-13 LAB — SARS CORONAVIRUS 2 (TAT 6-24 HRS): SARS Coronavirus 2: NEGATIVE

## 2022-10-19 ENCOUNTER — Other Ambulatory Visit: Payer: Self-pay

## 2022-10-19 ENCOUNTER — Inpatient Hospital Stay
Admission: EM | Admit: 2022-10-19 | Discharge: 2022-10-21 | DRG: 092 | Disposition: A | Discharge status: Home Health | Payer: Medicare Other | Attending: Student | Admitting: Student

## 2022-10-19 ENCOUNTER — Emergency Department: Payer: Medicare Other

## 2022-10-19 ENCOUNTER — Encounter: Payer: Self-pay | Admitting: Emergency Medicine

## 2022-10-19 DIAGNOSIS — Z79899 Other long term (current) drug therapy: Secondary | ICD-10-CM | POA: Diagnosis not present

## 2022-10-19 DIAGNOSIS — Z91040 Latex allergy status: Secondary | ICD-10-CM

## 2022-10-19 DIAGNOSIS — J4489 Other specified chronic obstructive pulmonary disease: Secondary | ICD-10-CM | POA: Diagnosis present

## 2022-10-19 DIAGNOSIS — M797 Fibromyalgia: Secondary | ICD-10-CM | POA: Diagnosis present

## 2022-10-19 DIAGNOSIS — G929 Unspecified toxic encephalopathy: Secondary | ICD-10-CM | POA: Insufficient documentation

## 2022-10-19 DIAGNOSIS — Z886 Allergy status to analgesic agent status: Secondary | ICD-10-CM

## 2022-10-19 DIAGNOSIS — G9341 Metabolic encephalopathy: Secondary | ICD-10-CM | POA: Diagnosis not present

## 2022-10-19 DIAGNOSIS — I10 Essential (primary) hypertension: Secondary | ICD-10-CM | POA: Insufficient documentation

## 2022-10-19 DIAGNOSIS — Z6831 Body mass index (BMI) 31.0-31.9, adult: Secondary | ICD-10-CM

## 2022-10-19 DIAGNOSIS — G250 Essential tremor: Secondary | ICD-10-CM | POA: Diagnosis present

## 2022-10-19 DIAGNOSIS — G8929 Other chronic pain: Secondary | ICD-10-CM | POA: Insufficient documentation

## 2022-10-19 DIAGNOSIS — G928 Other toxic encephalopathy: Secondary | ICD-10-CM | POA: Diagnosis present

## 2022-10-19 DIAGNOSIS — G894 Chronic pain syndrome: Secondary | ICD-10-CM | POA: Diagnosis present

## 2022-10-19 DIAGNOSIS — J4540 Moderate persistent asthma, uncomplicated: Secondary | ICD-10-CM | POA: Diagnosis present

## 2022-10-19 DIAGNOSIS — G4733 Obstructive sleep apnea (adult) (pediatric): Secondary | ICD-10-CM | POA: Diagnosis present

## 2022-10-19 DIAGNOSIS — Z882 Allergy status to sulfonamides status: Secondary | ICD-10-CM

## 2022-10-19 DIAGNOSIS — Z91048 Other nonmedicinal substance allergy status: Secondary | ICD-10-CM

## 2022-10-19 DIAGNOSIS — Z1152 Encounter for screening for COVID-19: Secondary | ICD-10-CM | POA: Diagnosis not present

## 2022-10-19 DIAGNOSIS — G2581 Restless legs syndrome: Secondary | ICD-10-CM | POA: Diagnosis present

## 2022-10-19 DIAGNOSIS — E871 Hypo-osmolality and hyponatremia: Secondary | ICD-10-CM

## 2022-10-19 DIAGNOSIS — E7800 Pure hypercholesterolemia, unspecified: Secondary | ICD-10-CM | POA: Diagnosis present

## 2022-10-19 DIAGNOSIS — I7300 Raynaud's syndrome without gangrene: Secondary | ICD-10-CM | POA: Diagnosis present

## 2022-10-19 DIAGNOSIS — M545 Low back pain, unspecified: Secondary | ICD-10-CM | POA: Diagnosis not present

## 2022-10-19 DIAGNOSIS — E669 Obesity, unspecified: Secondary | ICD-10-CM | POA: Insufficient documentation

## 2022-10-19 DIAGNOSIS — R4182 Altered mental status, unspecified: Principal | ICD-10-CM

## 2022-10-19 DIAGNOSIS — Z85828 Personal history of other malignant neoplasm of skin: Secondary | ICD-10-CM

## 2022-10-19 DIAGNOSIS — B338 Other specified viral diseases: Secondary | ICD-10-CM | POA: Diagnosis not present

## 2022-10-19 DIAGNOSIS — M549 Dorsalgia, unspecified: Secondary | ICD-10-CM | POA: Diagnosis present

## 2022-10-19 DIAGNOSIS — R296 Repeated falls: Secondary | ICD-10-CM | POA: Diagnosis present

## 2022-10-19 DIAGNOSIS — J329 Chronic sinusitis, unspecified: Secondary | ICD-10-CM

## 2022-10-19 DIAGNOSIS — G3184 Mild cognitive impairment, so stated: Secondary | ICD-10-CM | POA: Diagnosis present

## 2022-10-19 DIAGNOSIS — T428X1A Poisoning by antiparkinsonism drugs and other central muscle-tone depressants, accidental (unintentional), initial encounter: Secondary | ICD-10-CM | POA: Diagnosis not present

## 2022-10-19 DIAGNOSIS — K582 Mixed irritable bowel syndrome: Secondary | ICD-10-CM | POA: Diagnosis present

## 2022-10-19 DIAGNOSIS — F13239 Sedative, hypnotic or anxiolytic dependence with withdrawal, unspecified: Secondary | ICD-10-CM | POA: Diagnosis not present

## 2022-10-19 DIAGNOSIS — Z880 Allergy status to penicillin: Secondary | ICD-10-CM

## 2022-10-19 DIAGNOSIS — Z87891 Personal history of nicotine dependence: Secondary | ICD-10-CM

## 2022-10-19 DIAGNOSIS — Z91030 Bee allergy status: Secondary | ICD-10-CM

## 2022-10-19 DIAGNOSIS — G119 Hereditary ataxia, unspecified: Secondary | ICD-10-CM | POA: Diagnosis present

## 2022-10-19 DIAGNOSIS — B974 Respiratory syncytial virus as the cause of diseases classified elsewhere: Secondary | ICD-10-CM | POA: Diagnosis present

## 2022-10-19 DIAGNOSIS — F419 Anxiety disorder, unspecified: Secondary | ICD-10-CM | POA: Diagnosis present

## 2022-10-19 DIAGNOSIS — Z885 Allergy status to narcotic agent status: Secondary | ICD-10-CM

## 2022-10-19 DIAGNOSIS — Z888 Allergy status to other drugs, medicaments and biological substances status: Secondary | ICD-10-CM

## 2022-10-19 DIAGNOSIS — F32A Depression, unspecified: Secondary | ICD-10-CM | POA: Diagnosis present

## 2022-10-19 DIAGNOSIS — Z7951 Long term (current) use of inhaled steroids: Secondary | ICD-10-CM

## 2022-10-19 DIAGNOSIS — Z7982 Long term (current) use of aspirin: Secondary | ICD-10-CM

## 2022-10-19 LAB — COMPREHENSIVE METABOLIC PANEL
ALT: 22 U/L (ref 0–44)
AST: 25 U/L (ref 15–41)
Albumin: 4.1 g/dL (ref 3.5–5.0)
Alkaline Phosphatase: 79 U/L (ref 38–126)
Anion gap: 12 (ref 5–15)
BUN: 14 mg/dL (ref 8–23)
CO2: 21 mmol/L — ABNORMAL LOW (ref 22–32)
Calcium: 8.6 mg/dL — ABNORMAL LOW (ref 8.9–10.3)
Chloride: 91 mmol/L — ABNORMAL LOW (ref 98–111)
Creatinine, Ser: 0.91 mg/dL (ref 0.44–1.00)
GFR, Estimated: >60 mL/min (ref >60)
Glucose, Bld: 143 mg/dL — ABNORMAL HIGH (ref 70–99)
Potassium: 4.2 mmol/L (ref 3.5–5.1)
Sodium: 124 mmol/L — ABNORMAL LOW (ref 135–145)
Total Bilirubin: 0.4 mg/dL (ref 0.3–1.2)
Total Protein: 6.8 g/dL (ref 6.5–8.1)

## 2022-10-19 LAB — CBC WITH DIFFERENTIAL/PLATELET
Abs Immature Granulocytes: 0.24 10*3/uL — ABNORMAL HIGH (ref 0.00–0.07)
Basophils Absolute: 0.1 10*3/uL (ref 0.0–0.1)
Basophils Relative: 1 %
Eosinophils Absolute: 0.1 10*3/uL (ref 0.0–0.5)
Eosinophils Relative: 2 %
HCT: 38 % (ref 36.0–46.0)
Hemoglobin: 13 g/dL (ref 12.0–15.0)
Immature Granulocytes: 3 %
Lymphocytes Relative: 39 %
Lymphs Abs: 3.2 10*3/uL (ref 0.7–4.0)
MCH: 32.2 pg (ref 26.0–34.0)
MCHC: 34.2 g/dL (ref 30.0–36.0)
MCV: 94.1 fL (ref 80.0–100.0)
Monocytes Absolute: 0.8 10*3/uL (ref 0.1–1.0)
Monocytes Relative: 10 %
Neutro Abs: 3.8 10*3/uL (ref 1.7–7.7)
Neutrophils Relative %: 45 %
Platelets: 323 10*3/uL (ref 150–400)
RBC: 4.04 MIL/uL (ref 3.87–5.11)
RDW: 12.4 % (ref 11.5–15.5)
WBC: 8.3 10*3/uL (ref 4.0–10.5)
nRBC: 0 % (ref 0.0–0.2)

## 2022-10-19 LAB — URINE DRUG SCREEN, QUALITATIVE (ARMC ONLY)
Amphetamines, Ur Screen: NOT DETECTED
Barbiturates, Ur Screen: NOT DETECTED
Benzodiazepine, Ur Scrn: POSITIVE — AB
Cannabinoid 50 Ng, Ur ~~LOC~~: NOT DETECTED
Cocaine Metabolite,Ur ~~LOC~~: NOT DETECTED
MDMA (Ecstasy)Ur Screen: NOT DETECTED
Methadone Scn, Ur: NOT DETECTED
Opiate, Ur Screen: NOT DETECTED
Phencyclidine (PCP) Ur S: NOT DETECTED
Tricyclic, Ur Screen: NOT DETECTED

## 2022-10-19 LAB — URINALYSIS, ROUTINE W REFLEX MICROSCOPIC
Bilirubin Urine: NEGATIVE
Glucose, UA: NEGATIVE mg/dL
Hgb urine dipstick: NEGATIVE
Ketones, ur: NEGATIVE mg/dL
Leukocytes,Ua: NEGATIVE
Nitrite: NEGATIVE
Protein, ur: NEGATIVE mg/dL
Specific Gravity, Urine: 1.015 (ref 1.005–1.030)
pH: 5 (ref 5.0–8.0)

## 2022-10-19 LAB — RESP PANEL BY RT-PCR (RSV, FLU A&B, COVID)  RVPGX2
Influenza A by PCR: NEGATIVE
Influenza B by PCR: NEGATIVE
Resp Syncytial Virus by PCR: POSITIVE — AB
SARS Coronavirus 2 by RT PCR: NEGATIVE

## 2022-10-19 MED ORDER — ATORVASTATIN CALCIUM 20 MG PO TABS: 40.0000 mg | ORAL_TABLET | Freq: Every day | ORAL | Status: DC

## 2022-10-19 MED ORDER — MOMETASONE FURO-FORMOTEROL FUM 200-5 MCG/ACT IN AERO
2.0000 | INHALATION_SPRAY | Freq: Two times a day (BID) | RESPIRATORY_TRACT | Status: DC
  Administered 2022-10-20 – 2022-10-21 (×2): 2 via RESPIRATORY_TRACT
  Filled 2022-10-19: qty 8.8

## 2022-10-19 MED ORDER — ONDANSETRON HCL 4 MG/2ML IJ SOLN: 4.0000 mg | Freq: Four times a day (QID) | INTRAMUSCULAR | Status: DC | PRN

## 2022-10-19 MED ORDER — SODIUM CHLORIDE 0.9 % IV SOLN: INTRAVENOUS | Status: AC

## 2022-10-19 MED ORDER — FLUTICASONE PROPIONATE 50 MCG/ACT NA SUSP
1.0000 | Freq: Every day | NASAL | Status: DC
  Administered 2022-10-21: 1 via NASAL
  Filled 2022-10-19: qty 16

## 2022-10-19 MED ORDER — ONDANSETRON HCL 4 MG PO TABS: 4.0000 mg | ORAL_TABLET | Freq: Four times a day (QID) | ORAL | Status: DC | PRN

## 2022-10-19 MED ORDER — LISINOPRIL 20 MG PO TABS
30.0000 mg | ORAL_TABLET | Freq: Every day | ORAL | Status: DC
  Administered 2022-10-21: 30 mg via ORAL
  Filled 2022-10-19: qty 1

## 2022-10-19 MED ORDER — GUAIFENESIN 100 MG/5ML PO LIQD: 400.0000 mg | ORAL | Status: DC | PRN

## 2022-10-19 MED ORDER — ACETAMINOPHEN 650 MG RE SUPP: 650.0000 mg | Freq: Four times a day (QID) | RECTAL | Status: DC | PRN

## 2022-10-19 MED ORDER — LORAZEPAM 0.5 MG PO TABS
0.5000 mg | ORAL_TABLET | Freq: Once | ORAL | Status: AC
  Administered 2022-10-19: 0.5 mg via ORAL
  Filled 2022-10-19: qty 1

## 2022-10-19 MED ORDER — ACETAMINOPHEN 325 MG PO TABS
650.0000 mg | ORAL_TABLET | Freq: Four times a day (QID) | ORAL | Status: DC | PRN
  Administered 2022-10-20 – 2022-10-21 (×2): 650 mg via ORAL
  Filled 2022-10-19 (×2): qty 2

## 2022-10-19 MED ORDER — ENOXAPARIN SODIUM 40 MG/0.4ML IJ SOSY
40.0000 mg | PREFILLED_SYRINGE | INTRAMUSCULAR | Status: DC
  Administered 2022-10-20 – 2022-10-21 (×2): 40 mg via SUBCUTANEOUS
  Filled 2022-10-19 (×2): qty 0.4

## 2022-10-19 MED ORDER — SODIUM CHLORIDE 0.9 % IV SOLN: Freq: Once | INTRAVENOUS | Status: AC

## 2022-10-19 MED ORDER — SODIUM CHLORIDE 0.9% FLUSH
3.0000 mL | Freq: Two times a day (BID) | INTRAVENOUS | Status: DC
  Administered 2022-10-20 – 2022-10-21 (×2): 3 mL via INTRAVENOUS

## 2022-10-19 NOTE — ED Provider Notes (Signed)
-----------------------------------------   7:15 PM on 10/19/2022 ----------------------------------------- Patient seen in conjunction with physician assistant Ashok Cordia.  Patient has acute onset of altered mental status over the past several days which has been worsening.  Here the patient is acting almost manic, very active, laughing inappropriately at times.  Daughter is here with the patient states this is very atypical for the patient atypical behavior and she has never seen her act like this.  Patient has recently been diagnosed with several ailments including a sinus infection and upper respiratory infection.  Daughter states the patient just completed a course of Levaquin as well as prednisone which ended yesterday.  Patient is also prescribed baclofen for back pain and states she is taken 2 tablets today.  Patient's labs have resulted showing a chemistry with a sodium of 124.  Daughter states a history of hyponatremia previously has required hospitalization.  CBC is normal, urinalysis shows no sign of infection, urine drug screen is benzodiazepine positive only which the patient is prescribed.  CT scan head shows no acute abnormality.  Given the patient's altered mental status I do believe an MRI is warranted to rule out CVA however I believe the patient symptoms are more likely related to her hyponatremia, possibly prednisone induced psychosis, possible medication reaction from baclofen mix with her other medications.  We will start the patient on normal saline infusion to help raise the patient's sodium level.  We will obtain an MRI to ensure no CVA.  We will continue to IV hydrate and allow the patient to metabolize any prescription medications that may be in her system.  Patient will require admission to the hospital given her altered mental state and hyponatremia.  Do not believe the patient meets hypertonic saline criteria.  Daughter agreeable to this plan of care.   Harvest Dark,  MD 10/19/22 [phone]

## 2022-10-19 NOTE — ED Triage Notes (Signed)
Patient in recliner in triage continuously trying to get up stating "I have to leave. This is a joke. I have to get out of here." Patients daughter here attempting to calm patient down. Patient hysterically laughing in triage at daughter. Patient orientated to self and time but disorientated to location and situation.

## 2022-10-19 NOTE — Assessment & Plan Note (Addendum)
Possible acute psychosis ?steroid induced Mild cognitive deficit Polypharmacy Multifactorial and related to hyponatremia, RSV, possible steroid-induced psychosis, possible overdose of baclofen (3 tablets), with underlying mild cognitive dysfunction as well as polypharmacy -Stop steroids - Correct hyponatremia as will be separately outlined - Hold psychoactive meds - Delirium precautions - Supportive care for RSV -Follow-up MRI

## 2022-10-19 NOTE — Assessment & Plan Note (Signed)
Not acutely exacerbated

## 2022-10-19 NOTE — Assessment & Plan Note (Signed)
Patient is on multiple medications including Linzess, Nexium, Lomotil, Bentyl and Lotronex Given altered mental status, will hold these meds and use as needed

## 2022-10-19 NOTE — ED Triage Notes (Signed)
Arrives from home via [facility].  Per report, numerous bottles of prescribed meds on table and numerous pills in unmarked bottle.  Daughter states patient took too many muscle relaxers.  VS wnl.

## 2022-10-19 NOTE — ED Provider Notes (Signed)
[facility] Provider Note    Event Date/Time   First MD Initiated Contact with Patient 10/19/22 1944     (approximate)   History   Altered Mental Status   HPI  Sonia Robinson is a 71 y.o. female with history of asthma, osteoarthritis, restless leg syndrome, Raynaud's, basal cell carcinoma presents emergency department with altered mental status.  The daughter states that she got confused and got lost when she went to the trash can at her apartment.  Could not remember her apartment was sent and how to get there.  States that she was very frightened when on the phone with her.  States she did not know where she was and was very altered.  Upon arrival by EMS she noted that her mother is constantly laughing and acting abnormal.  The patient denies any EtOH or drug use other than her regular medications.      Physical Exam   Triage Vital Signs: ED Triage Vitals  Enc Vitals Group     BP 10/19/22 1717 125/77     Pulse Rate 10/19/22 1717 72     Resp 10/19/22 1717 18     Temp 10/19/22 1717 98 F (36.7 C)     Temp Source 10/19/22 1717 Oral     SpO2 10/19/22 1717 97 %     Weight 10/19/22 1718 183 lb (83 kg)     Height 10/19/22 1718 '5\' 4"'$  (1.626 m)     Head Circumference --      Peak Flow --      Pain Score 10/19/22 1718 0     Pain Loc --      Pain Edu? --      Excl. in [place]? --     Most recent vital signs: Vitals:   10/19/22 2230 10/19/22 2234  BP: 129/84   Pulse: (!) 58 60  Resp: 14 16  Temp:    SpO2: 100% 100%     General: Awake, no distress.   CV:  Good peripheral perfusion. regular rate and  rhythm Resp:  Normal effort. Lungs cta Abd:  No distention.   Other:   Patient laughing constantly, cn II-XII grossly intact   ED Results / Procedures / Treatments   Labs (all labs ordered are listed, but only abnormal results are displayed) Labs Reviewed  RESP PANEL BY RT-PCR (RSV, FLU A&B, COVID)  RVPGX2 - Abnormal; Notable for the following  components:      Result Value   Resp Syncytial Virus by PCR POSITIVE (*)    All other components within normal limits  URINALYSIS, ROUTINE W REFLEX MICROSCOPIC - Abnormal; Notable for the following components:   Color, Urine YELLOW (*)    APPearance CLEAR (*)    All other components within normal limits  URINE DRUG SCREEN, QUALITATIVE ([facility] ONLY) - Abnormal; Notable for the following components:   Benzodiazepine, Ur Scrn POSITIVE (*)    All other components within normal limits  COMPREHENSIVE METABOLIC PANEL - Abnormal; Notable for the following components:   Sodium 124 (*)    Chloride 91 (*)    CO2 21 (*)    Glucose, Bld 143 (*)    Calcium 8.6 (*)    All other components within normal limits  CBC WITH DIFFERENTIAL/PLATELET - Abnormal; Notable for the following components:   Abs Immature Granulocytes 0.24 (*)    All other components within normal limits     EKG     RADIOLOGY Ct head, mri  brain    PROCEDURES:   Procedures   MEDICATIONS ORDERED IN ED: Medications  0.9 %  sodium chloride infusion (0 mLs Intravenous Stopped 10/19/22 2234)  LORazepam (ATIVAN) tablet 0.5 mg (0.5 mg Oral Given 10/19/22 2039)     IMPRESSION / MDM / ASSESSMENT AND PLAN / ED COURSE  I reviewed the triage vital signs and the nursing notes.                              Differential diagnosis includes, but is not limited to, cva, drug overdose, hyponatremia, hysteria  Patient's presentation is most consistent with acute presentation with potential threat to life or bodily function.   Labs are concerning for hyponatremia, benzo in urine which is one of her regular medications, and rsv  Ct head independently reviewed and interpreted by me as being negative for any acute abnormality  MRI of the brain  Plan at this time is to admit the patient due to the hyponatremia and RSV.   Patient was given 1 L normal saline, Ativan 0.5 mg p.o. prior to the MRI, care transferred to Dr. Kerman Passey,  plan still is to admit the patient   FINAL CLINICAL IMPRESSION(S) / ED DIAGNOSES   Final diagnoses:  Altered mental status, unspecified altered mental status type  Hyponatremia     Rx / DC Orders   ED Discharge Orders     None        Note:  This document was prepared using Dragon voice recognition software and may include unintentional dictation errors.    Versie Starks, PA-C 10/19/22 2300    Harvest Dark, MD 10/19/22 [phone]

## 2022-10-19 NOTE — H&P (Signed)
History and Physical    Patient: Sonia Robinson RSW:[id number] DOB: 1950-10-31 DOA: 10/19/2022 DOS: the patient was seen and examined on 10/19/2022 PCP: Rochel Brome, MD  Patient coming from: Home  Chief Complaint:  Chief Complaint  Patient presents with   Altered Mental Status    HPI: Sonia Robinson is a 71 y.o. female with medical history significant for Asthma, COPD, anxiety HTN depression, essential tremor, fibromyalgia, IBS, OSA on CPAP, mild cognitive disorder, RLS and essential tremor, multiple falls in part secondary to sensory and cerebellar ataxia attributed in part to polypharmacy, per neurology note 05/2022, and on multiple psychoactive medications including baclofen, Klonopin, temazepam, Rozerem, and multiple IBS meds, who was brought to the ED with a 3-day history of altered mental status, talking outside of her head, saying things that do not make sense.  Daughter was concerned that patient may have taken 2 or maybe 3 of her baclofen tablets at once.  Patient was recently diagnosed with sinusitis and started on prednisone.  She otherwise has no cough or shortness of breath, vomiting, diarrhea or abdominal pain or dysuria and she has had no fever or chills ED course and data review: Vitals within normal limits.  CBC WNL, CMP significant for sodium of 124.  RSV positive.  Urinalysis unremarkable.  UDS positive for benzodiazepines. EKG not done from the EDCT head showing bilateral maxillary and sphenoid sinus disease without acute intracranial abnormality.  MRI ordered and pending at the time of admission.      Past Medical History:  Diagnosis Date   Asthma    Basal cell carcinoma 05/31/2022   Left lateral cheek, exc 07/03/2022   Cataract    Chronic pain syndrome    Degenerative joint disease of ankle and foot    Disorder of sacrum    Fallen bladder    Hemorrhoids    High cholesterol    Involuntary muscle contractions    Irritable bowel syndrome    Loss of  bladder control    Low back pain    Movement disorder    Nasal inflammation due to allergen    Obesity    Osteoarthritis    Pain in thoracic spine    Prolapse of vaginal vault after hysterectomy    Raynaud's disease with gangrene (HCC)    Restless leg syndrome    Sleep apnea    Sleep disorder    Spasmodic colon    Tremor    Vaginal atrophy    Past Surgical History:  Procedure Laterality Date   CARDIAC CATHETERIZATION N/A 08/06/2015   Procedure: Left Heart Cath and Coronary Angiography;  Surgeon: Yolonda Kida, MD;  Location: [facility];  Service: Cardiovascular;  Laterality: N/A;   Social History:  reports that she has quit smoking. Her smoking use included cigarettes. She smoked an average of .25 packs per day. She has never used smokeless tobacco. She reports current alcohol use of about 4.0 standard drinks of alcohol per week. She reports that she does not use drugs.  Allergies  Allergen Reactions   Bee Venom Anaphylaxis   Betadine [Povidone Iodine] Shortness Of Breath, Swelling and Rash   Iodides Shortness Of Breath, Swelling and Rash    Tongue swelling    Morphine And Related Anaphylaxis and Swelling   Oxycodone Anaphylaxis, Hives, Itching and Swelling    Lips became swollen   Umeclidinium Swelling and Palpitations    Incruse Ellipta- Had tongue swelling and burning sensation in throat/tongue   Wellbutrin [  Bupropion] Swelling    Became swollen all over (including tongue)   Codeine Swelling   Depakote [Divalproex Sodium] Hives and Nausea And Vomiting   Ibuprofen Other (See Comments)    "irritates IBS" "ended up with an ulcer"      Naproxen Swelling   Nsaids Other (See Comments)    Marked GI upset   Requip [Ropinirole] Other (See Comments)    Caused "body jolts," doctor stopped   Tape Swelling and Other (See Comments)    Swelling and redness at site applied    Augmentin [Amoxicillin-Pot Clavulanate] Hives, Swelling and Rash   Latex Hives, Itching,  Swelling and Rash   Melatonin Itching and Rash   Penicillins Hives, Itching, Swelling and Rash   Shellfish Allergy Diarrhea, Nausea And Vomiting, Swelling and Rash   Sulfa Antibiotics Hives, Itching and Swelling    History reviewed. No pertinent family history.  Prior to Admission medications   Medication Sig Start Date End Date Taking? Authorizing Provider  albuterol (PROVENTIL HFA;VENTOLIN HFA) 108 (90 BASE) MCG/ACT inhaler Inhale 2 puffs into the lungs every 6 (six) hours as needed for wheezing or shortness of breath.    [provider]  alosetron (LOTRONEX) 1 MG tablet Take 1 mg by mouth 2 (two) times daily.    [provider]  aspirin EC 81 MG EC tablet Take 1 tablet (81 mg total) by mouth daily. Patient not taking: Reported on 11/13/2017 08/05/15   Demetrios Loll, MD  atorvastatin (LIPITOR) 40 MG tablet Take 40 mg by mouth at bedtime.     [provider]  azelastine (ASTELIN) 0.1 % nasal spray 2 sprays into each nostril Two (2) times a day. Use in each nostril as directed 11/08/21   [provider]  Azelastine HCl 137 MCG/SPRAY SOLN Place 2 sprays into the nose 2 (two) times daily.    [provider]  baclofen (LIORESAL) 10 MG tablet Take 10 mg by mouth 2 (two) times daily as needed for muscle spasms.    [provider]  budesonide-formoterol (SYMBICORT) 160-4.5 MCG/ACT inhaler Inhale 2 puffs into the lungs 2 (two) times daily.    [provider]  busPIRone (BUSPAR) 15 MG tablet Take 30 mg by mouth 2 (two) times daily. Patient not taking: Reported on 05/31/2022    [provider]  Carboxymethylcell-Hypromellose (GENTEAL) 0.25-0.3 % GEL Place 1 application into both eyes daily as needed.    [provider]  cetirizine (ZYRTEC) 10 MG tablet Take 10 mg by mouth daily. Patient not taking: Reported on 05/31/2022    [provider]  Cholecalciferol (VITAMIN D3) 2000 units TABS Take 2,000 Units by mouth at  bedtime.    [provider]  clonazePAM (KLONOPIN) 1 MG tablet Take 1 mg by mouth 3 (three) times daily as needed for anxiety.    [provider]  conjugated estrogens (PREMARIN) vaginal cream Place 1 Applicatorful vaginally 2 (two) times a week. Patient not taking: Reported on 05/31/2022    [provider]  cycloSPORINE (RESTASIS) 0.05 % ophthalmic emulsion Place 1 drop into both eyes every 12 (twelve) hours.     [provider]  diclofenac sodium (VOLTAREN) 1 % GEL Apply 2 g topically 4 (four) times daily as needed (for pain).     [provider]  dicyclomine (BENTYL) 20 MG tablet Take 20 mg by mouth 4 (four) times daily.     [provider]  diphenoxylate-atropine (LOMOTIL) 2.5-0.025 MG tablet Take 1 tablet by mouth 3 (  three) times daily as needed for diarrhea or loose stools.    [provider]  esomeprazole (NEXIUM) 40 MG capsule Take 40 mg by mouth daily before breakfast.    [provider]  FLUoxetine (PROZAC) 20 MG capsule Take 60 mg by mouth every morning.    [provider]  fluticasone (FLONASE) 50 MCG/ACT nasal spray Place 1 spray into both nostrils 2 (two) times daily.  Patient not taking: Reported on 05/31/2022    [provider]  fluticasone-salmeterol (ADVAIR HFA) 115-21 MCG/ACT inhaler Inhale 2 puffs into the lungs 2 (two) times daily.    [provider]  folic acid (FOLVITE) 128 MCG tablet Take 800 mcg by mouth daily.    [provider]  gabapentin (NEURONTIN) 300 MG capsule Take 300-900 mg by mouth at bedtime. Patient not taking: Reported on 05/31/2022    [provider]  guaifenesin (HUMIBID E) 400 MG TABS tablet Take 200 mg by mouth 3 (three) times daily.    [provider]  HYDROcodone-acetaminophen (NORCO/VICODIN) 5-325 MG tablet Take 1 tablet by mouth every 4 (four) hours as needed. 11/14/17   Ward, Delice Bison, DO  hydrocortisone (PROCTOSOL HC) 2.5 % rectal  cream Place 1 application rectally daily as needed for hemorrhoids or itching.    [provider]  lidocaine (XYLOCAINE) 5 % ointment Apply 1 application topically 2 (two) times daily as needed for mild pain.    [provider]  LINZESS 290 MCG CAPS capsule Take 290 mcg by mouth daily.    [provider]  lisinopril (ZESTRIL) 30 MG tablet Take 30 mg by mouth daily.    [provider]  Magnesium Oxide (MAG-OX 400 PO) Take 800 mg by mouth at bedtime.    [provider]  mometasone (ELOCON) 0.1 % cream Apply twice daily to hands and arms up to 2 weeks as needed for eczema 05/31/22   Brendolyn Patty, MD  montelukast (SINGULAIR) 10 MG tablet Take 10 mg by mouth at bedtime.    [provider]  multivitamin-lutein (OCUVITE-LUTEIN) CAPS capsule Take 1 capsule by mouth daily.    [provider]  nystatin (MYCOSTATIN/NYSTOP) powder Apply topically as needed (to affected areas). Patient not taking: Reported on 05/31/2022    [provider]  nystatin cream (MYCOSTATIN) Apply 1 application topically 2 (two) times daily as needed for dry skin.  Patient not taking: Reported on 05/31/2022    [provider]  olopatadine (PATANOL) 0.1 % ophthalmic solution Place 1 drop into both eyes daily.    [provider]  Omega-3 Fatty Acids (FISH OIL) 1000 MG CAPS Take 3,000 mg by mouth at bedtime.    [provider]  ondansetron (ZOFRAN ODT) 4 MG disintegrating tablet Take 1 tablet (4 mg total) by mouth every 8 (eight) hours as needed for nausea or vomiting. Patient not taking: Reported on 05/31/2022 11/14/17   Ward, Delice Bison, DO  pseudoephedrine (SUDAFED) 30 MG tablet Take 30 mg by mouth every 4 (four) hours as needed for congestion.    [provider]  pyridOXINE (VITAMIN B-6) 100 MG tablet Take 200 mg by mouth daily.    [provider]  ramelteon (ROZEREM) 8 MG tablet Take 8 mg by mouth at bedtime.    [provider]  temazepam (RESTORIL) 30 MG capsule Take 30 mg by mouth at bedtime.    [provider]  tiotropium (SPIRIVA) 18 MCG inhalation capsule Place 18 mcg into inhaler and inhale daily.  [provider]  urea (CARMOL) 40 % ointment Apply 1 application topically every evening. Patient not taking: Reported on 05/31/2022    [provider]    Physical Exam: Vitals:   10/19/22 1717 10/19/22 1718 10/19/22 2038  BP: 125/77  (!) 126/91  Pulse: 72  61  Resp: 18  19  Temp: 98 F (36.7 C)  97.8 F (36.6 C)  TempSrc: Oral  Oral  SpO2: 97%  99%  Weight:  83 kg   Height:  '5\' 4"'$  (1.626 m)    Physical Exam Vitals and nursing note reviewed.  Constitutional:      General: She is not in acute distress.    Comments: Disoriented, says it is 1923  HENT:     Head: Normocephalic and atraumatic.  Cardiovascular:     Rate and Rhythm: Normal rate and regular rhythm.     Heart sounds: Normal heart sounds.  Pulmonary:     Effort: Pulmonary effort is normal.     Breath sounds: Normal breath sounds.  Abdominal:     Palpations: Abdomen is soft.     Tenderness: There is no abdominal tenderness.  Neurological:     General: No focal deficit present.     Mental Status: She is lethargic and disoriented.     Labs on Admission: I have personally reviewed following labs and imaging studies  CBC: Recent Labs  Lab 10/19/22 1750  WBC 8.3  NEUTROABS 3.8  HGB 13.0  HCT 38.0  MCV 94.1  PLT 833   Basic Metabolic Panel: Recent Labs  Lab 10/19/22 1750  NA 124*  K 4.2  CL 91*  CO2 21*  GLUCOSE 143*  BUN 14  CREATININE 0.91  CALCIUM 8.6*   GFR: Estimated Creatinine Clearance: 60.8 mL/min (by C-G formula based on SCr of 0.91 mg/dL). Liver Function Tests: Recent Labs  Lab 10/19/22 1750  AST 25  ALT 22  ALKPHOS 79  BILITOT 0.4  PROT 6.8  ALBUMIN 4.1   No results for input(s): "LIPASE", "AMYLASE" in the last 168 hours. No results for input(s): "AMMONIA"  in the last 168 hours. Coagulation Profile: No results for input(s): "INR", "PROTIME" in the last 168 hours. Cardiac Enzymes: No results for input(s): "CKTOTAL", "CKMB", "CKMBINDEX", "TROPONINI" in the last 168 hours. BNP (last 3 results) No results for input(s): "PROBNP" in the last 8760 hours. HbA1C: No results for input(s): "HGBA1C" in the last 72 hours. CBG: No results for input(s): "GLUCAP" in the last 168 hours. Lipid Profile: No results for input(s): "CHOL", "HDL", "LDLCALC", "TRIG", "CHOLHDL", "LDLDIRECT" in the last 72 hours. Thyroid Function Tests: No results for input(s): "TSH", "T4TOTAL", "FREET4", "T3FREE", "THYROIDAB" in the last 72 hours. Anemia Panel: No results for input(s): "VITAMINB12", "FOLATE", "FERRITIN", "TIBC", "IRON", "RETICCTPCT" in the last 72 hours. Urine analysis:    Component Value Date/Time   COLORURINE YELLOW (A) 10/19/2022 1733   APPEARANCEUR CLEAR (A) 10/19/2022 1733   LABSPEC 1.015 10/19/2022 1733   PHURINE 5.0 10/19/2022 1733   GLUCOSEU NEGATIVE 10/19/2022 1733   HGBUR NEGATIVE 10/19/2022 1733   BILIRUBINUR NEGATIVE 10/19/2022 1733   KETONESUR NEGATIVE 10/19/2022 1733   PROTEINUR NEGATIVE 10/19/2022 1733   NITRITE NEGATIVE 10/19/2022 1733   LEUKOCYTESUR NEGATIVE 10/19/2022 1733    Radiological Exams on Admission: CT HEAD WO CONTRAST (5MM)  Result Date: 10/19/2022 CLINICAL DATA:  Altered mental status EXAM: CT HEAD WITHOUT CONTRAST TECHNIQUE: Contiguous axial images were obtained from the base of the skull through the vertex without intravenous contrast.  RADIATION DOSE REDUCTION: This exam was performed according to the departmental dose-optimization program which includes automated exposure control, adjustment of the mA and/or kV according to patient size and/or use of iterative reconstruction technique. COMPARISON:  None Available. FINDINGS: Brain: Normal anatomic configuration. No abnormal intra or extra-axial mass lesion or fluid collection.  No abnormal mass effect or midline shift. No evidence of acute intracranial hemorrhage or infarct. Ventricular size is normal. Cerebellum unremarkable. Vascular: Unremarkable Skull: Intact Sinuses/Orbits: Air-fluid levels are noted within the maxillary and sphenoid sinuses bilaterally. Remaining paranasal sinuses are clear. Orbits are unremarkable. Other: Mastoid air cells and middle ear cavities are clear. IMPRESSION: 1. No acute intracranial abnormality. 2. Bilateral maxillary and sphenoid sinus disease. Electronically Signed   By: Fidela Salisbury M.D.   On: 10/19/2022 18:06     Data Reviewed: Relevant notes from primary care and specialist visits, past discharge summaries as available in EHR, including Care Everywhere. Prior diagnostic testing as pertinent to current admission diagnoses Updated medications and problem lists for reconciliation ED course, including vitals, labs, imaging, treatment and response to treatment Triage notes, nursing and pharmacy notes and ED provider's notes Notable results as noted in HPI   Assessment and Plan: * Acute metabolic encephalopathy Possible acute psychosis ?steroid induced Mild cognitive deficit Polypharmacy Multifactorial and related to hyponatremia, RSV, possible steroid-induced psychosis, possible overdose of baclofen (3 tablets), with underlying mild cognitive dysfunction as well as polypharmacy -Stop steroids - Correct hyponatremia as will be separately outlined - Hold psychoactive meds - Delirium precautions - Supportive care for RSV  Hyponatremia Suspecting hypovolemic given recent acute sinusitis IV hydration with NS and monitor serum sodium Will get urine and sodium osmolality and urine sodium  Infection due to respiratory syncytial virus (RSV) Patient with sinusitis but otherwise no respiratory complaints of cough or shortness of breath Droplet precaution Supportive care Flonase nasal spray, guaifenesin, consider  antihistamine  Suspected Accidental overdose of baclofen Hold baclofen Continuous cardiac monitoring IV hydration  Polypharmacy Will hold psychoactive meds to include Rozerem, temazepam, Klonopin and baclofen Will hold IBS meds of Lomotil, Linzess, Bentyl and Lotronex  Moderate persistent asthma without complication Not acutely exacerbated  Irritable bowel syndrome with both constipation and diarrhea Patient is on multiple medications including Linzess, Nexium, Lomotil, Bentyl and Lotronex Given altered mental status, will hold these meds and use as needed  Essential tremor Followed by neurology        DVT prophylaxis: Lovenox  Consults: none  Advance Care Planning:   Code Status: Prior   Family Communication: none  Disposition Plan: Back to previous home environment  Severity of Illness: The appropriate patient status for this patient is INPATIENT. Inpatient status is judged to be reasonable and necessary in order to provide the required intensity of service to ensure the patient's safety. The patient's presenting symptoms, physical exam findings, and initial radiographic and laboratory data in the context of their chronic comorbidities is felt to place them at high risk for further clinical deterioration. Furthermore, it is not anticipated that the patient will be medically stable for discharge from the hospital within 2 midnights of admission.   * I certify that at the point of admission it is my clinical judgment that the patient will require inpatient hospital care spanning beyond 2 midnights from the point of admission due to high intensity of service, high risk for further deterioration and high frequency of surveillance required.*  Author: Athena Masse, MD 10/19/2022 10:23 PM  For on call review [email or web address].

## 2022-10-19 NOTE — Assessment & Plan Note (Signed)
Followed by neurology.   

## 2022-10-19 NOTE — Assessment & Plan Note (Signed)
Flonase ordered Continue azelastine, Zyrtec, guaifenesin Hold Sudafed DC steroids

## 2022-10-19 NOTE — Assessment & Plan Note (Signed)
Hold baclofen Continuous cardiac monitoring IV hydration

## 2022-10-19 NOTE — ED Provider Triage Note (Signed)
Emergency Medicine Provider Triage Evaluation Note  LANEICE MENEELY, a 71 y.o. female  was evaluated in triage.  Pt complains of AMS. She presents via EMS from home after calling her daughter and sounding confused, paranoid and altered on the phone. She is now loquacious and laughing uncontrollably in triage. Patient may have taken her muscle relaxant incorrectly.   Review of Systems  Positive: AMS Negative: LOC  Physical Exam  BP 125/77   Pulse 72   Temp 98 F (36.7 C) (Oral)   Resp 18   Ht '5\' 4"'$  (1.626 m)   Wt 83 kg   SpO2 97%   BMI 31.41 kg/m  Gen:   Awake, no distress  NAD Resp:  Normal effort CTA MSK:   Moves extremities without difficulty  Other:    Medical Decision Making  Medically screening exam initiated at 5:40 PM.  Appropriate orders placed.  ANALUISA TUDOR was informed that the remainder of the evaluation will be completed by another provider, this initial triage assessment does not replace that evaluation, and the importance of remaining in the ED until their evaluation is complete.  Patient to the ED for evaluation of AMS.    Melvenia Needles, PA-C 10/19/22 1742

## 2022-10-19 NOTE — Assessment & Plan Note (Signed)
Patient with sinusitis but otherwise no respiratory complaints of cough or shortness of breath Droplet precaution Supportive care Flonase nasal spray, guaifenesin, consider antihistamine

## 2022-10-19 NOTE — Assessment & Plan Note (Signed)
Suspecting hypovolemic given recent acute sinusitis IV hydration with NS and monitor serum sodium Will get urine and sodium osmolality and urine sodium

## 2022-10-19 NOTE — ED Notes (Signed)
Pt and family awaiting admitting hospitalist

## 2022-10-19 NOTE — Assessment & Plan Note (Addendum)
Will hold psychoactive meds to include Rozerem, temazepam, Klonopin and baclofen Will hold IBS meds of Lomotil, Linzess, Bentyl and Lotronex

## 2022-10-20 DIAGNOSIS — K582 Mixed irritable bowel syndrome: Secondary | ICD-10-CM

## 2022-10-20 DIAGNOSIS — J4540 Moderate persistent asthma, uncomplicated: Secondary | ICD-10-CM

## 2022-10-20 DIAGNOSIS — F419 Anxiety disorder, unspecified: Secondary | ICD-10-CM

## 2022-10-20 DIAGNOSIS — T428X1A Poisoning by antiparkinsonism drugs and other central muscle-tone depressants, accidental (unintentional), initial encounter: Secondary | ICD-10-CM | POA: Diagnosis not present

## 2022-10-20 DIAGNOSIS — E669 Obesity, unspecified: Secondary | ICD-10-CM

## 2022-10-20 DIAGNOSIS — E871 Hypo-osmolality and hyponatremia: Secondary | ICD-10-CM

## 2022-10-20 DIAGNOSIS — Z79899 Other long term (current) drug therapy: Secondary | ICD-10-CM

## 2022-10-20 DIAGNOSIS — G9341 Metabolic encephalopathy: Secondary | ICD-10-CM | POA: Diagnosis not present

## 2022-10-20 DIAGNOSIS — M545 Low back pain, unspecified: Secondary | ICD-10-CM

## 2022-10-20 DIAGNOSIS — G250 Essential tremor: Secondary | ICD-10-CM

## 2022-10-20 DIAGNOSIS — B338 Other specified viral diseases: Secondary | ICD-10-CM | POA: Diagnosis not present

## 2022-10-20 DIAGNOSIS — G8929 Other chronic pain: Secondary | ICD-10-CM | POA: Insufficient documentation

## 2022-10-20 DIAGNOSIS — G929 Unspecified toxic encephalopathy: Secondary | ICD-10-CM

## 2022-10-20 DIAGNOSIS — F32A Depression, unspecified: Secondary | ICD-10-CM

## 2022-10-20 DIAGNOSIS — I10 Essential (primary) hypertension: Secondary | ICD-10-CM | POA: Insufficient documentation

## 2022-10-20 LAB — RENAL FUNCTION PANEL
Albumin: 3.8 g/dL (ref 3.5–5.0)
Anion gap: 7 (ref 5–15)
BUN: 9 mg/dL (ref 8–23)
CO2: 28 mmol/L (ref 22–32)
Calcium: 8.9 mg/dL (ref 8.9–10.3)
Chloride: 96 mmol/L — ABNORMAL LOW (ref 98–111)
Creatinine, Ser: 0.75 mg/dL (ref 0.44–1.00)
GFR, Estimated: >60 mL/min (ref >60)
Glucose, Bld: 97 mg/dL (ref 70–99)
Phosphorus: 3.5 mg/dL (ref 2.5–4.6)
Potassium: 4.1 mmol/L (ref 3.5–5.1)
Sodium: 131 mmol/L — ABNORMAL LOW (ref 135–145)

## 2022-10-20 LAB — CK: Total CK: 67 U/L (ref 38–234)

## 2022-10-20 LAB — HIV ANTIBODY (ROUTINE TESTING W REFLEX): HIV Screen 4th Generation wRfx: NONREACTIVE

## 2022-10-20 LAB — MAGNESIUM: Magnesium: 2.1 mg/dL (ref 1.7–2.4)

## 2022-10-20 LAB — SODIUM, URINE, RANDOM: Sodium, Ur: 18 mmol/L

## 2022-10-20 LAB — CBG MONITORING, ED: Glucose-Capillary: 95 mg/dL (ref 70–99)

## 2022-10-20 LAB — CREATININE, URINE, RANDOM: Creatinine, Urine: 59 mg/dL

## 2022-10-20 LAB — OSMOLALITY, URINE: Osmolality, Ur: 323 mOsm/kg (ref 300–900)

## 2022-10-20 MED ORDER — TEMAZEPAM 7.5 MG PO CAPS
7.5000 mg | ORAL_CAPSULE | Freq: Every day | ORAL | Status: DC
  Administered 2022-10-20: 7.5 mg via ORAL
  Filled 2022-10-20: qty 1

## 2022-10-20 MED ORDER — HYDROCORTISONE (PERIANAL) 2.5 % EX CREA: 1.0000 | TOPICAL_CREAM | Freq: Every day | CUTANEOUS | Status: DC | PRN

## 2022-10-20 MED ORDER — VITAMIN D 25 MCG (1000 UNIT) PO TABS
2000.0000 [IU] | ORAL_TABLET | Freq: Every day | ORAL | Status: DC
  Administered 2022-10-20: 2000 [IU] via ORAL
  Filled 2022-10-20: qty 2

## 2022-10-20 MED ORDER — CLONAZEPAM 0.5 MG PO TABS
0.5000 mg | ORAL_TABLET | Freq: Two times a day (BID) | ORAL | Status: DC | PRN
  Administered 2022-10-20: 0.5 mg via ORAL
  Filled 2022-10-20: qty 1

## 2022-10-20 MED ORDER — TIOTROPIUM BROMIDE MONOHYDRATE 18 MCG IN CAPS
18.0000 ug | ORAL_CAPSULE | Freq: Every day | RESPIRATORY_TRACT | Status: DC
  Administered 2022-10-21: 18 ug via RESPIRATORY_TRACT
  Filled 2022-10-20: qty 5

## 2022-10-20 MED ORDER — PANTOPRAZOLE SODIUM 40 MG PO TBEC
40.0000 mg | DELAYED_RELEASE_TABLET | Freq: Every day | ORAL | Status: DC
  Administered 2022-10-20 – 2022-10-21 (×2): 40 mg via ORAL
  Filled 2022-10-20 (×2): qty 1

## 2022-10-20 MED ORDER — FLUOXETINE HCL 20 MG PO CAPS
60.0000 mg | ORAL_CAPSULE | Freq: Every morning | ORAL | Status: DC
  Administered 2022-10-21: 60 mg via ORAL
  Filled 2022-10-20: qty 3

## 2022-10-20 MED ORDER — HALOPERIDOL LACTATE 5 MG/ML IJ SOLN: 5.0000 mg | Freq: Once | INTRAMUSCULAR | Status: AC

## 2022-10-20 MED ORDER — ALBUTEROL SULFATE (2.5 MG/3ML) 0.083% IN NEBU: 2.5000 mg | INHALATION_SOLUTION | Freq: Four times a day (QID) | RESPIRATORY_TRACT | Status: DC | PRN

## 2022-10-20 MED ORDER — MONTELUKAST SODIUM 10 MG PO TABS
10.0000 mg | ORAL_TABLET | Freq: Every day | ORAL | Status: DC
  Administered 2022-10-20: 10 mg via ORAL
  Filled 2022-10-20: qty 1

## 2022-10-20 MED ORDER — HALOPERIDOL LACTATE 5 MG/ML IJ SOLN
INTRAMUSCULAR | Status: AC
  Administered 2022-10-20: 5 mg via INTRAVENOUS
  Filled 2022-10-20: qty 1

## 2022-10-20 MED ORDER — FOLIC ACID 1 MG PO TABS
1000.0000 ug | ORAL_TABLET | Freq: Every day | ORAL | Status: DC
  Administered 2022-10-20 – 2022-10-21 (×2): 1 mg via ORAL
  Filled 2022-10-20 (×2): qty 1

## 2022-10-20 MED ORDER — SODIUM CHLORIDE 0.9 % IV SOLN: INTRAVENOUS | Status: AC

## 2022-10-20 MED ORDER — OLOPATADINE HCL 0.1 % OP SOLN
1.0000 [drp] | Freq: Every day | OPHTHALMIC | Status: DC
  Filled 2022-10-20: qty 5

## 2022-10-20 MED ORDER — BACLOFEN 10 MG PO TABS: 5.0000 mg | ORAL_TABLET | Freq: Two times a day (BID) | ORAL | Status: DC | PRN

## 2022-10-20 MED ORDER — PROSIGHT PO TABS
1.0000 | ORAL_TABLET | Freq: Every day | ORAL | Status: DC
  Administered 2022-10-21: 1 via ORAL
  Filled 2022-10-20 (×2): qty 1

## 2022-10-20 NOTE — ED Notes (Signed)
Pt is now sleeping on her right side. Remains on VS monitoring. Posey sitter alarm on. Pt's door closed d/t avoid re-agitating pt.

## 2022-10-20 NOTE — ED Notes (Signed)
Pt called out requesting to know where she was and why she was here. RN responded to call light informing pt was confused. Pt then repeated "I'm confused" and laughed hysterically for 10-15 minutes. Pt left with door open. Pt then proceeded to attempt to exit bed. Door remains open and sitter alarm placed. Attempts to redirect pt were made by the RN, neighbor RN, and NT without success. Pt continued to attempt to exit room. Floor coverage NP B Morrsion was informed. Clarified with NP pt is not having hallucinations. Medication orders placed. Haloperidol (see mar) was given per order. Pt remains on VS monitor. Posey sitter alarm remains on. Pts door is open for visualization. Call bell remains at bedside.

## 2022-10-20 NOTE — ED Notes (Signed)
Pt remains awake. Continues to state she is ready to go home. Has not made any recurrent attempts to exit bed. B Randol Kern was updated per her request

## 2022-10-20 NOTE — Progress Notes (Addendum)
PROGRESS NOTE  Sonia Robinson UVO:[id number] DOB: 1951/07/02   PCP: Loni Muse, MD  Patient is from: Home.  DOA: 10/19/2022 LOS: 1  Chief complaints Chief Complaint  Patient presents with   Altered Mental Status     Brief Narrative / Interim history: 71 year old F with PMH of asthma/COPD, exertional cerebral/cerebellar ataxia, RLS, HTN, OSA not on CPAP, mild cognitive impairment, anxiety, depression, IBS, fibromyalgia and chronic back pain brought to ED with 3 days history of altered mental status, talking out of her head, saying things that do not make sense and laughing uncontrollably.  Family concerned about patient taking extra dose of baclofen.  Recently diagnosed with sinusitis/URI and started on Levaquin and prednisone.  She is also on multiple sedating medications including baclofen, Klonopin, temazepam, Rozerem and multiple IBS medications.  In ED, normal vitals.  Basic labs without significant finding other than hyponatremia at 124.  RSV positive.  UDS positive for benzodiazepines.  CT head and MRI brain without acute intracranial abnormality other than bilateral maxillary and sphenoid sinus disease.   Subjective: Seen and examined earlier this morning.  No major events overnight of this morning.  Daughter at bedside.  Reports improvement in her mental status and confusion.  She is more redirectable.  No audiovisual hallucination.  Denies headache, chest pain, shortness of breath, nausea, vomiting or UTI symptoms.  Denies focal neurosymptoms.  Objective: Vitals:   10/20/22 0930 10/20/22 0953 10/20/22 1200 10/20/22 1300  BP: 95/64  138/62 118/75  Pulse: (!) 56 61 62 69  Resp: (!) '28 12 15 20  '$ Temp:  97.9 F (36.6 C)    TempSrc:  Oral    SpO2: 100% 99% 100% 99%  Weight:      Height:        Examination:  GENERAL: No apparent distress.  Nontoxic. HEENT: MMM.  Vision and hearing grossly intact.  NECK: Supple.  No apparent JVD.  RESP:  No IWOB.  Fair aeration  bilaterally. CVS:  RRR. Heart sounds normal.  ABD/GI/GU: BS+. Abd soft, NTND.  MSK/EXT:  Moves extremities. No apparent deformity. No edema.  SKIN: no apparent skin lesion or wound NEURO: Awake, alert and oriented x 4 except date.  Notable intention tremor and dysmetria bilaterally.  No apparent focal neuro deficit. PSYCH: Calm. Normal affect.   Procedures:  None  Microbiology summarized: RSV PCR positive. COVID-19 and influenza PCR nonreactive.  Assessment and plan: Principal Problem:   Acute metabolic encephalopathy Active Problems:   Hyponatremia   Infection due to respiratory syncytial virus (RSV)   Suspected Accidental overdose of baclofen   Sinusitis   Essential tremor   Irritable bowel syndrome with both constipation and diarrhea   Moderate persistent asthma without complication   Polypharmacy  Acute toxic metabolic encephalopathy-multifactorial including polypharmacy (baclofen, Klonopin, Restoril,....recent steroid and Levaquin), RSV infection, delirium and possibly hyponatremia.  She has underlying mild cognitive impairment.  Improved but not quite back to baseline per daughter.Oriented x 4 except date this morning.  CT head and MRI brain without acute intracranial finding but maxillary and sphenoid sinus disease.  Patient has no clinical signs of sinusitis on exam. -Reorientation and delirium precautions -Minimize sedating medications -Treat treatable causes -PT/OT -Fall precaution  Hyponatremia: Na 124 and improved to 131 with IV normal saline. -Continue IV normal saline at 100 cc an hour -Check urine sodium and osmolality.   Infection due to respiratory syncytial virus (RSV): No respiratory distress. -Supportive care.   Polypharmacy: Per daughter, she might have taken extra  dose of baclofen.  She is also on Neurontin, Norco, Rozerem, temazepam, Klonopin, Lomotil, Linzess, Bentyl and Lotronex. -Will review medications after med rec.   Moderate persistent asthma  without complication: No acute exacerbation. -Will continue home inhalers after med rec   IBS with constipation and diarrhea: Stable.  Anxiety/depression/fibromyalgia/chronic pain -Will reinitiate medications after med rec is appropriate  Essential hypertension: Normotensive. -Continue home lisinopril.   Essential tremor -Outpatient follow-up with neurology.   Obesity Body mass index is 31.41 kg/m.          DVT prophylaxis:  enoxaparin (LOVENOX) injection 40 mg Start: 10/20/22 0800  Code Status: Full code Family Communication: Updated patient's daughter at bedside Level of care: Med-Surg Status is: Inpatient Remains inpatient appropriate because: Acute toxic metabolic encephalopathy and hyponatremia   Final disposition: Likely home with home health in the next 24 to 48 hours. Consultants:  None  Sch Meds:  Scheduled Meds:  enoxaparin (LOVENOX) injection  40 mg Subcutaneous Q24H   fluticasone  1 spray Each Nare Daily   lisinopril  30 mg Oral Daily   mometasone-formoterol  2 puff Inhalation BID   sodium chloride flush  3 mL Intravenous Q12H   Continuous Infusions:  sodium chloride 100 mL/hr at 10/20/22 1333   PRN Meds:.acetaminophen **OR** acetaminophen, guaiFENesin, ondansetron **OR** ondansetron (ZOFRAN) IV  Antimicrobials: Anti-infectives (From admission, onward)    None        I have personally reviewed the following labs and images: CBC: Recent Labs  Lab 10/19/22 1750  WBC 8.3  NEUTROABS 3.8  HGB 13.0  HCT 38.0  MCV 94.1  PLT 323   BMP &GFR Recent Labs  Lab 10/19/22 1750 10/20/22 1002  NA 124* 131*  K 4.2 4.1  CL 91* 96*  CO2 21* 28  GLUCOSE 143* 97  BUN 14 9  CREATININE 0.91 0.75  CALCIUM 8.6* 8.9  MG  --  2.1  PHOS  --  3.5   Estimated Creatinine Clearance: 69.2 mL/min (by C-G formula based on SCr of 0.75 mg/dL). Liver & Pancreas: Recent Labs  Lab 10/19/22 1750 10/20/22 1002  AST 25  --   ALT 22  --   ALKPHOS 79  --    BILITOT 0.4  --   PROT 6.8  --   ALBUMIN 4.1 3.8   No results for input(s): "LIPASE", "AMYLASE" in the last 168 hours. No results for input(s): "AMMONIA" in the last 168 hours. Diabetic: No results for input(s): "HGBA1C" in the last 72 hours. Recent Labs  Lab 10/20/22 0750  GLUCAP 95   Cardiac Enzymes: No results for input(s): "CKTOTAL", "CKMB", "CKMBINDEX", "TROPONINI" in the last 168 hours. No results for input(s): "PROBNP" in the last 8760 hours. Coagulation Profile: No results for input(s): "INR", "PROTIME" in the last 168 hours. Thyroid Function Tests: No results for input(s): "TSH", "T4TOTAL", "FREET4", "T3FREE", "THYROIDAB" in the last 72 hours. Lipid Profile: No results for input(s): "CHOL", "HDL", "LDLCALC", "TRIG", "CHOLHDL", "LDLDIRECT" in the last 72 hours. Anemia Panel: No results for input(s): "VITAMINB12", "FOLATE", "FERRITIN", "TIBC", "IRON", "RETICCTPCT" in the last 72 hours. Urine analysis:    Component Value Date/Time   COLORURINE YELLOW (A) 10/19/2022 1733   APPEARANCEUR CLEAR (A) 10/19/2022 1733   LABSPEC 1.015 10/19/2022 1733   PHURINE 5.0 10/19/2022 1733   GLUCOSEU NEGATIVE 10/19/2022 1733   HGBUR NEGATIVE 10/19/2022 1733   BILIRUBINUR NEGATIVE 10/19/2022 1733   KETONESUR NEGATIVE 10/19/2022 1733   PROTEINUR NEGATIVE 10/19/2022 1733   NITRITE NEGATIVE 10/19/2022 1733  LEUKOCYTESUR NEGATIVE 10/19/2022 1733   Sepsis Labs: Invalid input(s): "PROCALCITONIN", "LACTICIDVEN"  Microbiology: Recent Results (from the past 240 hour(s))  SARS CORONAVIRUS 2 (TAT 6-24 HRS) Anterior Nasal Swab     Status: None   Collection Time: 10/12/22  4:34 PM   Specimen: Anterior Nasal Swab  Result Value Ref Range Status   SARS Coronavirus 2 NEGATIVE NEGATIVE Final    Comment: (NOTE) SARS-CoV-2 target nucleic acids are NOT DETECTED.  The SARS-CoV-2 RNA is generally detectable in upper and lower respiratory specimens during the acute phase of infection.  Negative results do not preclude SARS-CoV-2 infection, do not rule out co-infections with other pathogens, and should not be used as the sole basis for treatment or other patient management decisions. Negative results must be combined with clinical observations, patient history, and epidemiological information. The expected result is Negative.  Fact Sheet for Patients: SugarRoll.be  Fact Sheet for Healthcare Providers: https://www.woods-mathews.com/  This test is not yet approved or cleared by the [place] FDA and  has been authorized for detection and/or diagnosis of SARS-CoV-2 by FDA under an Emergency Use Authorization (EUA). This EUA will remain  in effect (meaning this test can be used) for the duration of the COVID-19 declaration under Se ction 564(b)(1) of the Act, 21 U.S.C. section 360bbb-3(b)(1), unless the authorization is terminated or revoked sooner.  Performed at [facility], [place] [address], [place], [place] [address]   Resp panel by RT-PCR (RSV, Flu A&B, Covid) Anterior Nasal Swab     Status: Abnormal   Collection Time: 10/19/22  7:09 PM   Specimen: Anterior Nasal Swab  Result Value Ref Range Status   SARS Coronavirus 2 by RT PCR NEGATIVE NEGATIVE Final    Comment: (NOTE) SARS-CoV-2 target nucleic acids are NOT DETECTED.  The SARS-CoV-2 RNA is generally detectable in upper respiratory specimens during the acute phase of infection. The lowest concentration of SARS-CoV-2 viral copies this assay can detect is 138 copies/mL. A negative result does not preclude SARS-Cov-2 infection and should not be used as the sole basis for treatment or other patient management decisions. A negative result may occur with  improper specimen collection/handling, submission of specimen other than nasopharyngeal swab, presence of viral mutation(s) within the areas targeted by this assay, and inadequate number of viral copies(<138  copies/mL). A negative result must be combined with clinical observations, patient history, and epidemiological information. The expected result is Negative.  Fact Sheet for Patients:  EntrepreneurPulse.com.au  Fact Sheet for Healthcare Providers:  IncredibleEmployment.be  This test is no t yet approved or cleared by the [place] FDA and  has been authorized for detection and/or diagnosis of SARS-CoV-2 by FDA under an Emergency Use Authorization (EUA). This EUA will remain  in effect (meaning this test can be used) for the duration of the COVID-19 declaration under Section 564(b)(1) of the Act, 21 U.S.C.section 360bbb-3(b)(1), unless the authorization is terminated  or revoked sooner.       Influenza A by PCR NEGATIVE NEGATIVE Final   Influenza B by PCR NEGATIVE NEGATIVE Final    Comment: (NOTE) The Xpert Xpress SARS-CoV-2/FLU/RSV plus assay is intended as an aid in the diagnosis of influenza from Nasopharyngeal swab specimens and should not be used as a sole basis for treatment. Nasal washings and aspirates are unacceptable for Xpert Xpress SARS-CoV-2/FLU/RSV testing.  Fact Sheet for Patients: EntrepreneurPulse.com.au  Fact Sheet for Healthcare Providers: IncredibleEmployment.be  This test is not yet approved or cleared by the [place] FDA  and has been authorized for detection and/or diagnosis of SARS-CoV-2 by FDA under an Emergency Use Authorization (EUA). This EUA will remain in effect (meaning this test can be used) for the duration of the COVID-19 declaration under Section 564(b)(1) of the Act, 21 U.S.C. section 360bbb-3(b)(1), unless the authorization is terminated or revoked.     Resp Syncytial Virus by PCR POSITIVE (A) NEGATIVE Final    Comment: (NOTE) Fact Sheet for Patients: EntrepreneurPulse.com.au  Fact Sheet for Healthcare  Providers: IncredibleEmployment.be  This test is not yet approved or cleared by the [place] FDA and has been authorized for detection and/or diagnosis of SARS-CoV-2 by FDA under an Emergency Use Authorization (EUA). This EUA will remain in effect (meaning this test can be used) for the duration of the COVID-19 declaration under Section 564(b)(1) of the Act, 21 U.S.C. section 360bbb-3(b)(1), unless the authorization is terminated or revoked.  Performed at [facility], [address] [place], [place] [address]     Radiology Studies: MR BRAIN WO CONTRAST  Result Date: 10/19/2022 CLINICAL DATA:  Altered mental status EXAM: MRI HEAD WITHOUT CONTRAST TECHNIQUE: Multiplanar, multiecho pulse sequences of the brain and surrounding structures were obtained without intravenous contrast. COMPARISON:  Head CT 10/19/2022 FINDINGS: Brain: No acute infarction, hemorrhage, hydrocephalus, extra-axial collection or mass lesion. No chronic microhemorrhage. Normal white matter signal. CSF spaces are normal. Normal midline structures. Vascular: Normal flow voids. Skull and upper cervical spine: Normal marrow signal. Sinuses/Orbits: Fluid levels in the maxillary and sphenoid sinuses. Normal orbits. Other: None IMPRESSION: 1. Normal MRI of the brain. 2. Fluid levels in the maxillary and sphenoid sinuses. Correlate for symptoms of acute sinusitis. Electronically Signed   By: Ulyses Jarred M.D.   On: 10/19/2022 22:36   CT HEAD WO CONTRAST (5MM)  Result Date: 10/19/2022 CLINICAL DATA:  Altered mental status EXAM: CT HEAD WITHOUT CONTRAST TECHNIQUE: Contiguous axial images were obtained from the base of the skull through the vertex without intravenous contrast. RADIATION DOSE REDUCTION: This exam was performed according to the departmental dose-optimization program which includes automated exposure control, adjustment of the mA and/or kV according to patient size and/or use of  iterative reconstruction technique. COMPARISON:  None Available. FINDINGS: Brain: Normal anatomic configuration. No abnormal intra or extra-axial mass lesion or fluid collection. No abnormal mass effect or midline shift. No evidence of acute intracranial hemorrhage or infarct. Ventricular size is normal. Cerebellum unremarkable. Vascular: Unremarkable Skull: Intact Sinuses/Orbits: Air-fluid levels are noted within the maxillary and sphenoid sinuses bilaterally. Remaining paranasal sinuses are clear. Orbits are unremarkable. Other: Mastoid air cells and middle ear cavities are clear. IMPRESSION: 1. No acute intracranial abnormality. 2. Bilateral maxillary and sphenoid sinus disease. Electronically Signed   By: Fidela Salisbury M.D.   On: 10/19/2022 18:06      Adell Koval T. [place]  If 7PM-7AM, please contact night-coverage [email or web address] 10/20/2022, 1:44 PM

## 2022-10-20 NOTE — Evaluation (Signed)
Occupational Therapy Evaluation Patient Details Name: Sonia Robinson MRN: [id number] DOB: 09-Feb-1951 Today's Date: 10/20/2022   History of Present Illness Sonia Robinson is a 71 y.o. female with medical history significant for Asthma, COPD, anxiety HTN depression, essential tremor, fibromyalgia, IBS, OSA on CPAP, mild cognitive disorder, RLS and essential tremor, multiple falls in part secondary to sensory and cerebellar ataxia attributed in part to polypharmacy, per neurology note 05/2022, and on multiple psychoactive medications including baclofen, Klonopin, temazepam, Rozerem, and multiple IBS meds, who was brought to the ED with a 3-day history of altered mental status, talking outside of her head, saying things that do not make sense.  Daughter was concerned that patient may have taken 2 or maybe 3 of her baclofen tablets at once.  Patient was recently diagnosed with sinusitis and started on prednisone.  She otherwise has no cough or shortness of breath, vomiting, diarrhea or abdominal pain or dysuria and she has had no fever or chills  ED course and data review: Vitals within normal limits.  CBC WNL, CMP significant for sodium of 124.  RSV positive.  Urinalysis unremarkable.  UDS positive for benzodiazepines.   Clinical Impression   Patient presenting with decreased Ind in self care, balance, functional mobility/transfers, endurance, and safety awareness. Patient living alone in a senior living apartment. She has baseline memory deficits but has modifciations in place to assist as needed. She drives with programmed GPS system and daughter assists with IADLs. Pt has essential tremor and utilizes weighted bracelets and weighted utensils to increase independence. Pt uses SPC for mobility at times but has had several falls this year. OT recommends she utilized rollator at hospital discharge. She performed bed mobility without physical assistance and stands to ambulate ~ 100' with RW and min guard. Per  her daughter she does have increased confusion. Daughter plans on taking her home at discharge for a brief period and then pt transitioning back to her apartment when safe to do so. Patient will benefit from acute OT to increase overall independence in the areas of ADLs, functional mobility, and safety awareness in order to safely discharge home with family.      Recommendations for follow up therapy are one component of a multi-disciplinary discharge planning process, led by the attending physician.  Recommendations may be updated based on patient status, additional functional criteria and insurance authorization.   Follow Up Recommendations  Home health OT     Assistance Recommended at Discharge Intermittent Supervision/Assistance  Patient can return home with the following A little help with walking and/or transfers;A little help with bathing/dressing/bathroom;Help with stairs or ramp for entrance;Assist for transportation;Direct supervision/assist for financial management;Direct supervision/assist for medications management;Assistance with cooking/housework    Functional Status Assessment  Patient has had a recent decline in their functional status and demonstrates the ability to make significant improvements in function in a reasonable and predictable amount of time.  Equipment Recommendations  None recommended by OT       Precautions / Restrictions Precautions Precautions: Fall      Mobility Bed Mobility Overal bed mobility: Needs Assistance Bed Mobility: Supine to Sit, Sit to Supine     Supine to sit: Supervision Sit to supine: Supervision   General bed mobility comments: cuing for safety    Transfers Overall transfer level: Needs assistance Equipment used: Rolling walker (2 wheels) Transfers: Sit to/from Stand Sit to Stand: Min guard  Balance Overall balance assessment: Needs assistance Sitting-balance support: Feet unsupported, Bilateral  upper extremity supported Sitting balance-Leahy Scale: Good     Standing balance support: Reliant on assistive device for balance, During functional activity, Bilateral upper extremity supported Standing balance-Leahy Scale: Fair                             ADL either performed or assessed with clinical judgement   ADL Overall ADL's : Needs assistance/impaired                         Toilet Transfer: Ambulation;Rolling walker (2 wheels) Toilet Transfer Details (indicate cue type and reason): simulated         Functional mobility during ADLs: Supervision/safety;Min guard       Vision Patient Visual Report: No change from baseline              Pertinent Vitals/Pain Pain Assessment Pain Assessment: No/denies pain        Extremity/Trunk Assessment Upper Extremity Assessment Upper Extremity Assessment: Generalized weakness;Overall [facility] for tasks assessed   Lower Extremity Assessment Lower Extremity Assessment: Generalized weakness;Overall WFL for tasks assessed       Communication Communication Communication: No difficulties   Cognition Arousal/Alertness: Awake/alert Behavior During Therapy: WFL for tasks assessed/performed Overall Cognitive Status: History of cognitive impairments - at baseline                                 General Comments: Pt's daughter present during session and reports pt's is more confused at this time. She has baseline memory deficits but is self aware and call's daughter who has modifciations in place to help pt to be more independent at home.                Home Living Family/patient expects to be discharged to:: Private residence Living Arrangements: Alone Available Help at Discharge: Family;Available PRN/intermittently Type of Home: Apartment Home Access: Elevator;Level entry     Home Layout: One level     Bathroom Shower/Tub: Tub/shower unit;Sponge bathes at baseline   [employer]:  Standard     Home Equipment: Rollator (4 wheels);Cane - single point;Other (comment)   Additional Comments: Pt has weighted bracelets and weighted utensils for meals      Prior Functioning/Environment Prior Level of Function : Independent/Modified Independent;History of Falls (last six months)               ADLs Comments: Pt lives in senior living apartment with use of SPC at baseline (she doesn't like to use). Pt washes in sink and microwaves meals. She does drive with use of programmed gps and daughter assists with other IADLs such as bills and laundry        OT Problem List: Decreased strength;Decreased activity tolerance;Impaired balance (sitting and/or standing);Decreased knowledge of use of DME or AE;Decreased safety awareness      OT Treatment/Interventions: Self-care/ADL training;Therapeutic exercise;Therapeutic activities;DME and/or AE instruction;Balance training;Patient/family education;Energy conservation;Cognitive remediation/compensation    OT Goals(Current goals can be found in the care plan section) Acute Rehab OT Goals Patient Stated Goal: to get stronger and return to apartment OT Goal Formulation: With patient/family Time For Goal Achievement: 11/03/22 Potential to Achieve Goals: Good ADL Goals Pt Will Perform Grooming: with modified independence;standing Pt Will Perform Lower Body Dressing: with modified independence;sit to/from stand Pt Will Transfer to  Toilet: with modified independence;ambulating Pt Will Perform Toileting - Clothing Manipulation and hygiene: with modified independence;sit to/from stand Pt/caregiver will Perform Home Exercise Program: Increased strength;Both right and left upper extremity;With theraband;With written HEP provided;With Supervision  OT Frequency: Min 2X/week       AM-PAC OT "6 Clicks" Daily Activity     Outcome Measure Help from another person eating meals?: None Help from another person taking care of personal  grooming?: None Help from another person toileting, which includes using toliet, bedpan, or urinal?: A Little Help from another person bathing (including washing, rinsing, drying)?: A Little Help from another person to put on and taking off regular upper body clothing?: None Help from another person to put on and taking off regular lower body clothing?: A Little 6 Click Score: 21   End of Session Equipment Utilized During Treatment: Rolling walker (2 wheels) Nurse Communication: Mobility status  Activity Tolerance: Patient tolerated treatment well Patient left: in bed;with call bell/phone within reach;with family/visitor present  OT Visit Diagnosis: Unsteadiness on feet (R26.81);Repeated falls (R29.6);Muscle weakness (generalized) (M62.81);History of falling (Z91.81)                Time: 4163-8453 OT Time Calculation (min): 32 min Charges:  OT General Charges $OT Visit: 1 Visit OT Evaluation $OT Eval Moderate Complexity: 1 Mod OT Treatments $Therapeutic Activity: 8-22 mins  Darleen Crocker, MS, OTR/L , CBIS ascom [phone]  10/20/22, 11:35 AM

## 2022-10-20 NOTE — Progress Notes (Signed)
       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  Nurse is reporting inability to redirect and bizrre behavior ie laughig hysterically and yelling out "Im confused" after reorientation and explanation attempts made by nurse  Assessment and  Interventions   Assessment: Last measured Qtc 373 Plan: Haldol 5 mg  IV x1       Kathlene Cote NP [facility]

## 2022-10-20 NOTE — Evaluation (Signed)
Physical Therapy Evaluation Patient Details Name: Sonia Robinson MRN: [id number] DOB: 1951/05/02 Today's Date: 10/20/2022  History of Present Illness  Sonia Robinson is a 71 y.o. female with medical history significant for Asthma, COPD, anxiety HTN depression, essential tremor, fibromyalgia, IBS, OSA on CPAP, mild cognitive disorder, RLS and essential tremor, multiple falls in part secondary to sensory and cerebellar ataxia attributed in part to polypharmacy, per neurology note 05/2022, and on multiple psychoactive medications including baclofen, Klonopin, temazepam, Rozerem, and multiple IBS meds, who was brought to the ED with a 3-day history of altered mental status, talking outside of her head, saying things that do not make sense.  Daughter was concerned that patient may have taken 2 or maybe 3 of her baclofen tablets at once.  Patient was recently diagnosed with sinusitis and started on prednisone.  She otherwise has no cough or shortness of breath, vomiting, diarrhea or abdominal pain or dysuria and she has had no fever or chills  ED course and data review: Vitals within normal limits.  CBC WNL, CMP significant for sodium of 124.  RSV positive.  Urinalysis unremarkable.  UDS positive for benzodiazepines.   Clinical Impression  Pt admitted with above diagnosis. Pt received upright in bed with daughter in room. Agreeable to PT services. At baseline she lives by herself in senior living apartment. Has mild cognitive impairment with memory issues so daughter is close by and assists pt  PRN. Has a history of falls and posterior LOB.   To pt dt mod-I with bed mobility and STS, able to ambulate 200' In ED, very minimal need for CGA due to IV line and lead management due to crowded ED hallway but pt successful in navigating hallway obstacles. Mild R/L drifting and unsteadiness but no LOB or need for UE support. Pt reutrning to supine in bed. Pt seems clsoe to her baseline. Education provided to pt and  daughter on possible OP PT referral due to baseline history of falls and LOB. Pt and daughter interested and plan to f/u with PCP after hospitalization. Due to living alone PT plans to rec [facility] PT services to ensure safe transition back home. Pt with all needs in reach with family in agreement to plan. PT to sign off.      Recommendations for follow up therapy are one component of a multi-disciplinary discharge planning process, led by the attending physician.  Recommendations may be updated based on patient status, additional functional criteria and insurance authorization.  Follow Up Recommendations Home health PT      Assistance Recommended at Discharge Intermittent Supervision/Assistance  Patient can return home with the following       Equipment Recommendations None recommended by PT  Recommendations for Other Services       Functional Status Assessment Patient has had a recent decline in their functional status and demonstrates the ability to make significant improvements in function in a reasonable and predictable amount of time.     Precautions / Restrictions Precautions Precautions: Fall Restrictions Weight Bearing Restrictions: No      Mobility  Bed Mobility Overal bed mobility: Modified Independent               Patient Response: Cooperative  Transfers Overall transfer level: Needs assistance Equipment used: None Transfers: Sit to/from Stand Sit to Stand: Supervision                Ambulation/Gait Ambulation/Gait assistance: Min guard Gait Distance (Feet): 200 Feet Assistive device: None Gait Pattern/deviations:  Step-through pattern, Decreased step length - right, Decreased step length - left       General Gait Details: UE's in high guard, mild drifting R/L. No LOB during gait bouts.  Stairs            Wheelchair Mobility    Modified Rankin (Stroke Patients Only)       Balance Overall balance assessment: Needs  assistance Sitting-balance support: Feet unsupported, Bilateral upper extremity supported Sitting balance-Leahy Scale: Good     Standing balance support: Reliant on assistive device for balance, During functional activity, Bilateral upper extremity supported Standing balance-Leahy Scale: Fair                               Pertinent Vitals/Pain Pain Assessment Pain Assessment: No/denies pain    Home Living Family/patient expects to be discharged to:: Private residence Living Arrangements: Alone Available Help at Discharge: Family;Available PRN/intermittently Type of Home: Apartment Home Access: Elevator;Level entry       Home Layout: One level Home Equipment: Rollator (4 wheels);Cane - single point;Other (comment) Additional Comments: Pt has weighted bracelets and weighted utensils for meals    Prior Function Prior Level of Function : Independent/Modified Independent;History of Falls (last six months)               ADLs Comments: Pt lives in senior living apartment with use of SPC at baseline (she doesn't like to use). Pt washes in sink and microwaves meals. She does drive with use of programmed gps and daughter assists with other IADLs such as bills and laundry     Hand Dominance        Extremity/Trunk Assessment   Upper Extremity Assessment Upper Extremity Assessment: Defer to OT evaluation    Lower Extremity Assessment Lower Extremity Assessment: Generalized weakness    Cervical / Trunk Assessment Cervical / Trunk Assessment: Normal  Communication   Communication: No difficulties  Cognition Arousal/Alertness: Awake/alert Behavior During Therapy: WFL for tasks assessed/performed Overall Cognitive Status: History of cognitive impairments - at baseline                                 General Comments: Daughter reports cognition returning. Remembering things from yesterday.        General Comments      Exercises Other  Exercises Other Exercises: Possible benefit to OP PT referral. Role of PT in acute setting.   Assessment/Plan    PT Assessment Patient does not need any further PT services;All further PT needs can be met in the next venue of care  PT Problem List         PT Treatment Interventions      PT Goals (Current goals can be found in the Care Plan section)  Acute Rehab PT Goals PT Goal Formulation: All assessment and education complete, DC therapy    Frequency       Co-evaluation               AM-PAC PT "6 Clicks" Mobility  Outcome Measure Help needed turning from your back to your side while in a flat bed without using bedrails?: None Help needed moving from lying on your back to sitting on the side of a flat bed without using bedrails?: None Help needed moving to and from a bed to a chair (including a wheelchair)?: A Little Help needed standing up from a  chair using your arms (e.g., wheelchair or bedside chair)?: A Little Help needed to walk in hospital room?: A Little Help needed climbing 3-5 steps with a railing? : A Little 6 Click Score: 20    End of Session Equipment Utilized During Treatment: Gait belt Activity Tolerance: Patient tolerated treatment well Patient left: in bed;with call bell/phone within reach;with family/visitor present Nurse Communication: Mobility status      Time: 6440-3474 PT Time Calculation (min) (ACUTE ONLY): 19 min   Charges:   PT Evaluation $PT Eval Low Complexity: Lehigh M. Fairly IV, PT, DPT Physical Therapist- [facility]  10/20/2022, 3:57 PM

## 2022-10-21 DIAGNOSIS — T428X1A Poisoning by antiparkinsonism drugs and other central muscle-tone depressants, accidental (unintentional), initial encounter: Secondary | ICD-10-CM | POA: Diagnosis not present

## 2022-10-21 DIAGNOSIS — G9341 Metabolic encephalopathy: Secondary | ICD-10-CM | POA: Diagnosis not present

## 2022-10-21 DIAGNOSIS — E871 Hypo-osmolality and hyponatremia: Secondary | ICD-10-CM | POA: Diagnosis not present

## 2022-10-21 DIAGNOSIS — B338 Other specified viral diseases: Secondary | ICD-10-CM | POA: Diagnosis not present

## 2022-10-21 LAB — RENAL FUNCTION PANEL
Albumin: 3.4 g/dL — ABNORMAL LOW (ref 3.5–5.0)
Anion gap: 4 — ABNORMAL LOW (ref 5–15)
BUN: 13 mg/dL (ref 8–23)
CO2: 29 mmol/L (ref 22–32)
Calcium: 9 mg/dL (ref 8.9–10.3)
Chloride: 101 mmol/L (ref 98–111)
Creatinine, Ser: 0.78 mg/dL (ref 0.44–1.00)
GFR, Estimated: >60 mL/min (ref >60)
Glucose, Bld: 115 mg/dL — ABNORMAL HIGH (ref 70–99)
Phosphorus: 3.9 mg/dL (ref 2.5–4.6)
Potassium: 4.3 mmol/L (ref 3.5–5.1)
Sodium: 134 mmol/L — ABNORMAL LOW (ref 135–145)

## 2022-10-21 LAB — CBC
HCT: 34.9 % — ABNORMAL LOW (ref 36.0–46.0)
Hemoglobin: 12.2 g/dL (ref 12.0–15.0)
MCH: 32.3 pg (ref 26.0–34.0)
MCHC: 35 g/dL (ref 30.0–36.0)
MCV: 92.3 fL (ref 80.0–100.0)
Platelets: 275 10*3/uL (ref 150–400)
RBC: 3.78 MIL/uL — ABNORMAL LOW (ref 3.87–5.11)
RDW: 12.8 % (ref 11.5–15.5)
WBC: 6.6 10*3/uL (ref 4.0–10.5)
nRBC: 0 % (ref 0.0–0.2)

## 2022-10-21 LAB — MAGNESIUM: Magnesium: 2 mg/dL (ref 1.7–2.4)

## 2022-10-21 LAB — GLUCOSE, CAPILLARY: Glucose-Capillary: 98 mg/dL (ref 70–99)

## 2022-10-21 MED ORDER — BACLOFEN 10 MG PO TABS
10.0000 mg | ORAL_TABLET | Freq: Three times a day (TID) | ORAL | Status: DC
  Administered 2022-10-21: 10 mg via ORAL
  Filled 2022-10-21: qty 1

## 2022-10-21 MED ORDER — BUSPIRONE HCL 10 MG PO TABS
30.0000 mg | ORAL_TABLET | Freq: Two times a day (BID) | ORAL | Status: DC
  Filled 2022-10-21: qty 3

## 2022-10-21 MED ORDER — LORATADINE 10 MG PO TABS
10.0000 mg | ORAL_TABLET | Freq: Every day | ORAL | Status: DC
  Administered 2022-10-21: 10 mg via ORAL
  Filled 2022-10-21: qty 1

## 2022-10-21 MED ORDER — AZELASTINE HCL 0.1 % NA SOLN
2.0000 | Freq: Two times a day (BID) | NASAL | Status: DC
  Administered 2022-10-21: 2 via NASAL
  Filled 2022-10-21: qty 30

## 2022-10-21 MED ORDER — CLONAZEPAM 1 MG PO TABS
1.0000 mg | ORAL_TABLET | Freq: Three times a day (TID) | ORAL | Status: DC | PRN
  Administered 2022-10-21: 1 mg via ORAL
  Filled 2022-10-21: qty 1

## 2022-10-21 NOTE — Discharge Summary (Signed)
Physician Discharge Summary  Sonia Robinson VZD:[id number] DOB: [birth year] DOA: 10/19/2022  PCP: Loni Muse, MD  Admit date: 10/19/2022 Discharge date: 10/21/2022 Admitted From: Home Disposition: Home Recommendations for Outpatient Follow-up:  Follow up with PCP in in 1 to 2 weeks. Outpatient follow-up with nephrology, psychiatry and neurology Check sodium in 1 to 2 weeks. Patient is at risk polypharmacy.  Recommend review of medications at follow-up. Please follow up on the following pending results: None  Home Health: PT/OT Equipment/Devices: None  Discharge Condition: Stable CODE STATUS: Full code  Follow-up Information     Loni Muse, MD. Schedule an appointment as soon as possible for a visit in 1 week(s).   Specialty: Internal Medicine Contact information: [address] [place] [address] [place] [address] [phone]                 Hospital course 71 year old F with PMH of asthma/COPD, exertional cerebral/cerebellar ataxia, RLS, HTN, OSA not on CPAP, mild cognitive impairment, anxiety, depression, IBS, fibromyalgia and chronic back pain brought to ED with 3 days history of altered mental status, talking out of her head, saying things that do not make sense and laughing uncontrollably.  Family concerned about patient taking extra dose of baclofen.  Recently diagnosed with sinusitis/URI and started on Levaquin and prednisone.  She is also on multiple sedating medications including baclofen, Klonopin, temazepam, Rozerem and multiple IBS medications.   In ED, normal vitals.  Basic labs without significant finding other than hyponatremia at 124.  RSV positive.  UDS positive for benzodiazepines.  CT head and MRI brain without acute intracranial abnormality other than bilateral maxillary and sphenoid sinus disease but patient without clinical signs of acute sinusitis.  On the day of discharge, significant improvement in his symptoms.  Slightly worsening  essential tremor likely due to withdrawal from benzodiazepines that were held on admission due to mental status change.  This has improved after resuming home medications.  Hyponatremia improved to 134 with IV normal saline.  She is discharged with home health PT/OT as recommended by therapy.  Strongly recommend reviewing her medications due to her risk of polypharmacy.  Check sodium in 1 to 2 weeks.  Finding and plan discussed with patient's daughter throughout her hospitalization and  on the day of discharge.  See individual problem list below for more.   Problems addressed during this hospitalization Principal Problem:   Acute metabolic encephalopathy Active Problems:   Hyponatremia   Infection due to respiratory syncytial virus (RSV)   Sinusitis   Essential tremor   Irritable bowel syndrome with both constipation and diarrhea   Moderate persistent asthma without complication   Polypharmacy   Essential hypertension   Toxic encephalopathy   Anxiety and depression   Chronic back pain   Obesity (BMI 30-39.9)              Vital signs Vitals:   10/21/22 0109 10/21/22 0458 10/21/22 0500 10/21/22 0755  BP: (!) 152/62 139/71  (!) 151/74  Pulse: 62 68  67  Temp: 98 F (36.7 C) 98 F (36.7 C)  98.4 F (36.9 C)  Resp: '18 19  17  '$ Height:      Weight:   90.7 kg   SpO2: 100% 100%  100%  TempSrc:  Oral    BMI (Calculated):   34.31      Discharge exam  GENERAL: No apparent distress.  Nontoxic. HEENT: MMM.  Vision and hearing grossly intact.  NECK: Supple.  No apparent JVD.  RESP:  No IWOB.  Fair aeration bilaterally. CVS:  RRR. Heart sounds normal.  ABD/GI/GU: BS+. Abd soft, NTND.  MSK/EXT:  Moves extremities. No apparent deformity. No edema.  SKIN: no apparent skin lesion or wound NEURO: Awake and alert. Oriented appropriately.  Notable essential tremor in her arms.  No apparent focal neuro deficit. PSYCH: Calm. Normal affect.   Discharge Instructions Discharge  Instructions     Call MD for:  difficulty breathing, headache or visual disturbances   Complete by: As directed    Call MD for:  extreme fatigue   Complete by: As directed    Call MD for:  persistant dizziness or light-headedness   Complete by: As directed    Diet - low sodium heart healthy   Complete by: As directed    Discharge instructions   Complete by: As directed    It has been a pleasure taking care of you!  You were hospitalized due to altered mental status likely from medications, RSV infection and possibly low sodium level.  Combination of baclofen, Klonopin, Restoril, Rozerem, Bentyl, Lomotil and prednisone could increase your risk of confusion.  We strongly recommend not taking extra dose of this medication and talking to your prescribers.  Your sodium level has improved.  Follow-up with your primary care doctor in 1 to 2 weeks or sooner if needed.  Follow up with your neurologist, psychiatrist and nephrologist as previously planned or sooner if needed.   Take care,   Increase activity slowly   Complete by: As directed       Allergies as of 10/21/2022       Reactions   Bee Venom Anaphylaxis   Betadine [povidone Iodine] Shortness Of Breath, Swelling, Rash   Iodides Shortness Of Breath, Swelling, Rash   Tongue swelling   Morphine And Related Anaphylaxis, Swelling   Oxycodone Anaphylaxis, Hives, Itching, Swelling   Lips became swollen   Umeclidinium Swelling, Palpitations   Incruse Ellipta- Had tongue swelling and burning sensation in throat/tongue   Wellbutrin [bupropion] Swelling   Became swollen all over (including tongue)   Codeine Swelling   Depakote [divalproex Sodium] Hives, Nausea And Vomiting   Ibuprofen Other (See Comments)   "irritates IBS" "ended up with an ulcer"   Naproxen Swelling   Nsaids Other (See Comments)   Marked GI upset   Requip [ropinirole] Other (See Comments)   Caused "body jolts," doctor stopped   Tape Swelling, Other (See Comments)    Swelling and redness at site applied    Augmentin [amoxicillin-pot Clavulanate] Hives, Swelling, Rash   Latex Hives, Itching, Swelling, Rash   Melatonin Itching, Rash   Penicillins Hives, Itching, Swelling, Rash   Shellfish Allergy Diarrhea, Nausea And Vomiting, Swelling, Rash   Sulfa Antibiotics Hives, Itching, Swelling        Medication List     STOP taking these medications    budesonide-formoterol 160-4.5 MCG/ACT inhaler Commonly known as: SYMBICORT   conjugated estrogens 0.625 MG/GM vaginal cream Commonly known as: PREMARIN   esomeprazole 40 MG capsule Commonly known as: NEXIUM   gabapentin 300 MG capsule Commonly known as: NEURONTIN   HYDROcodone-acetaminophen 5-325 MG tablet Commonly known as: NORCO/VICODIN   MAG-OX 400 PO   nystatin cream Commonly known as: MYCOSTATIN   nystatin powder Commonly known as: MYCOSTATIN/NYSTOP       TAKE these medications    albuterol 108 (90 Base) MCG/ACT inhaler Commonly known as: VENTOLIN HFA Inhale 2 puffs into the lungs every 6 (six) hours as needed  for wheezing or shortness of breath.   alosetron 1 MG tablet Commonly known as: LOTRONEX Take 1 mg by mouth 2 (two) times daily. Notes to patient: Not given in the hospital   aspirin EC 81 MG tablet Take 1 tablet (81 mg total) by mouth daily. Notes to patient: Not given in the hospital   atorvastatin 40 MG tablet Commonly known as: LIPITOR Take 40 mg by mouth at bedtime. Notes to patient: Not given in the hospital   Azelastine HCl 137 MCG/SPRAY Soln Place 2 sprays into the nose 2 (two) times daily.   azelastine 0.1 % nasal spray Commonly known as: ASTELIN 2 sprays into each nostril Two (2) times a day. Use in each nostril as directed   baclofen 10 MG tablet Commonly known as: LIORESAL Take 10 mg by mouth 2 (two) times daily as needed for muscle spasms.   busPIRone 15 MG tablet Commonly known as: BUSPAR Take 30 mg by mouth 2 (two) times daily. Notes to  patient: Not given in the hospital   cetirizine 10 MG tablet Commonly known as: ZYRTEC Take 10 mg by mouth daily.   clonazePAM 1 MG tablet Commonly known as: KLONOPIN Take 1 mg by mouth 3 (three) times daily as needed for anxiety.   cycloSPORINE 0.05 % ophthalmic emulsion Commonly known as: RESTASIS Place 1 drop into both eyes every 12 (twelve) hours.   diclofenac sodium 1 % Gel Commonly known as: VOLTAREN Apply 2 g topically 4 (four) times daily as needed (for pain).   dicyclomine 20 MG tablet Commonly known as: BENTYL Take 20 mg by mouth 4 (four) times daily.   diphenoxylate-atropine 2.5-0.025 MG tablet Commonly known as: LOMOTIL Take 1 tablet by mouth 3 (three) times daily as needed for diarrhea or loose stools.   Fish Oil 1000 MG Caps Take 3,000 mg by mouth at bedtime.   FLUoxetine 20 MG capsule Commonly known as: PROZAC Take 60 mg by mouth every morning.   fluticasone 50 MCG/ACT nasal spray Commonly known as: FLONASE Place 1 spray into both nostrils 2 (two) times daily.   fluticasone-salmeterol 115-21 MCG/ACT inhaler Commonly known as: ADVAIR HFA Inhale 2 puffs into the lungs 2 (two) times daily.   folic acid 657 MCG tablet Commonly known as: FOLVITE Take 800 mcg by mouth daily.   GenTeal 0.25-0.3 % Gel Generic drug: Carboxymethylcell-Hypromellose Place 1 application into both eyes daily as needed.   guaifenesin 400 MG Tabs tablet Commonly known as: HUMIBID E Take 200 mg by mouth 3 (three) times daily.   lidocaine 5 % ointment Commonly known as: XYLOCAINE Apply 1 application topically 2 (two) times daily as needed for mild pain.   Linzess 290 MCG Caps capsule Generic drug: linaclotide Take 290 mcg by mouth daily.   lisinopril 30 MG tablet Commonly known as: ZESTRIL Take 30 mg by mouth daily.   mometasone 0.1 % cream Commonly known as: ELOCON Apply twice daily to hands and arms up to 2 weeks as needed for eczema   montelukast 10 MG  tablet Commonly known as: SINGULAIR Take 10 mg by mouth at bedtime.   multivitamin-lutein Caps capsule Take 1 capsule by mouth daily.   olopatadine 0.1 % ophthalmic solution Commonly known as: PATANOL Place 1 drop into both eyes daily.   omeprazole 40 MG capsule Commonly known as: PRILOSEC Take 40 mg by mouth 2 (two) times daily.   ondansetron 4 MG disintegrating tablet Commonly known as: Zofran ODT Take 1 tablet (4 mg total) by  mouth every 8 (eight) hours as needed for nausea or vomiting.   Proctosol HC 2.5 % rectal cream Generic drug: hydrocortisone Place 1 application rectally daily as needed for hemorrhoids or itching.   pseudoephedrine 30 MG tablet Commonly known as: SUDAFED Take 30 mg by mouth every 4 (four) hours as needed for congestion.   pyridOXINE 100 MG tablet Commonly known as: VITAMIN B6 Take 200 mg by mouth daily.   ramelteon 8 MG tablet Commonly known as: ROZEREM Take 8 mg by mouth at bedtime.   temazepam 30 MG capsule Commonly known as: RESTORIL Take 30 mg by mouth at bedtime.   tiotropium 18 MCG inhalation capsule Commonly known as: SPIRIVA Place 18 mcg into inhaler and inhale daily.   Tyrvaya 0.03 MG/ACT Soln Generic drug: Varenicline Tartrate Place 1 spray into both nostrils 2 (two) times daily.   urea 40 % ointment Commonly known as: CARMOL Apply 1 application topically every evening.   Vitamin D3 50 MCG (2000 UT) Tabs Take 2,000 Units by mouth at bedtime.        Consultations: None  Procedures/Studies:   MR BRAIN WO CONTRAST  Result Date: 10/19/2022 CLINICAL DATA:  Altered mental status EXAM: MRI HEAD WITHOUT CONTRAST TECHNIQUE: Multiplanar, multiecho pulse sequences of the brain and surrounding structures were obtained without intravenous contrast. COMPARISON:  Head CT 10/19/2022 FINDINGS: Brain: No acute infarction, hemorrhage, hydrocephalus, extra-axial collection or mass lesion. No chronic microhemorrhage. Normal white  matter signal. CSF spaces are normal. Normal midline structures. Vascular: Normal flow voids. Skull and upper cervical spine: Normal marrow signal. Sinuses/Orbits: Fluid levels in the maxillary and sphenoid sinuses. Normal orbits. Other: None IMPRESSION: 1. Normal MRI of the brain. 2. Fluid levels in the maxillary and sphenoid sinuses. Correlate for symptoms of acute sinusitis. Electronically Signed   By: Ulyses Jarred M.D.   On: 10/19/2022 22:36   CT HEAD WO CONTRAST (5MM)  Result Date: 10/19/2022 CLINICAL DATA:  Altered mental status EXAM: CT HEAD WITHOUT CONTRAST TECHNIQUE: Contiguous axial images were obtained from the base of the skull through the vertex without intravenous contrast. RADIATION DOSE REDUCTION: This exam was performed according to the departmental dose-optimization program which includes automated exposure control, adjustment of the mA and/or kV according to patient size and/or use of iterative reconstruction technique. COMPARISON:  None Available. FINDINGS: Brain: Normal anatomic configuration. No abnormal intra or extra-axial mass lesion or fluid collection. No abnormal mass effect or midline shift. No evidence of acute intracranial hemorrhage or infarct. Ventricular size is normal. Cerebellum unremarkable. Vascular: Unremarkable Skull: Intact Sinuses/Orbits: Air-fluid levels are noted within the maxillary and sphenoid sinuses bilaterally. Remaining paranasal sinuses are clear. Orbits are unremarkable. Other: Mastoid air cells and middle ear cavities are clear. IMPRESSION: 1. No acute intracranial abnormality. 2. Bilateral maxillary and sphenoid sinus disease. Electronically Signed   By: Fidela Salisbury M.D.   On: 10/19/2022 18:06   DG Chest 2 View  Result Date: 10/12/2022 CLINICAL DATA:  Productive cough, body aches, congestion, and wheezing. EXAM: CHEST - 2 VIEW COMPARISON:  Chest x-ray dated November 13, 2017. FINDINGS: The heart size and mediastinal contours are within normal  limits. Both lungs are clear. The visualized skeletal structures are unremarkable. IMPRESSION: No active cardiopulmonary disease. Electronically Signed   By: Titus Dubin M.D.   On: 10/12/2022 16:44       The results of significant diagnostics from this hospitalization (including imaging, microbiology, ancillary and laboratory) are listed below for reference.     Microbiology: Recent Results (  from the past 240 hour(s))  SARS CORONAVIRUS 2 (TAT 6-24 HRS) Anterior Nasal Swab     Status: None   Collection Time: 10/12/22  4:34 PM   Specimen: Anterior Nasal Swab  Result Value Ref Range Status   SARS Coronavirus 2 NEGATIVE NEGATIVE Final    Comment: (NOTE) SARS-CoV-2 target nucleic acids are NOT DETECTED.  The SARS-CoV-2 RNA is generally detectable in upper and lower respiratory specimens during the acute phase of infection. Negative results do not preclude SARS-CoV-2 infection, do not rule out co-infections with other pathogens, and should not be used as the sole basis for treatment or other patient management decisions. Negative results must be combined with clinical observations, patient history, and epidemiological information. The expected result is Negative.  Fact Sheet for Patients: SugarRoll.be  Fact Sheet for Healthcare Providers: https://www.woods-mathews.com/  This test is not yet approved or cleared by the [place] FDA and  has been authorized for detection and/or diagnosis of SARS-CoV-2 by FDA under an Emergency Use Authorization (EUA). This EUA will remain  in effect (meaning this test can be used) for the duration of the COVID-19 declaration under Se ction 564(b)(1) of the Act, 21 U.S.C. section 360bbb-3(b)(1), unless the authorization is terminated or revoked sooner.  Performed at [facility], [place] [address] [place], [place] [address]   Resp panel by RT-PCR (RSV, Flu A&B, Covid) Anterior Nasal Swab      Status: Abnormal   Collection Time: 10/19/22  7:09 PM   Specimen: Anterior Nasal Swab  Result Value Ref Range Status   SARS Coronavirus 2 by RT PCR NEGATIVE NEGATIVE Final    Comment: (NOTE) SARS-CoV-2 target nucleic acids are NOT DETECTED.  The SARS-CoV-2 RNA is generally detectable in upper respiratory specimens during the acute phase of infection. The lowest concentration of SARS-CoV-2 viral copies this assay can detect is 138 copies/mL. A negative result does not preclude SARS-Cov-2 infection and should not be used as the sole basis for treatment or other patient management decisions. A negative result may occur with  improper specimen collection/handling, submission of specimen other than nasopharyngeal swab, presence of viral mutation(s) within the areas targeted by this assay, and inadequate number of viral copies(<138 copies/mL). A negative result must be combined with clinical observations, patient history, and epidemiological information. The expected result is Negative.  Fact Sheet for Patients:  EntrepreneurPulse.com.au  Fact Sheet for Healthcare Providers:  IncredibleEmployment.be  This test is no t yet approved or cleared by the [place] FDA and  has been authorized for detection and/or diagnosis of SARS-CoV-2 by FDA under an Emergency Use Authorization (EUA). This EUA will remain  in effect (meaning this test can be used) for the duration of the COVID-19 declaration under Section 564(b)(1) of the Act, 21 U.S.C.section 360bbb-3(b)(1), unless the authorization is terminated  or revoked sooner.       Influenza A by PCR NEGATIVE NEGATIVE Final   Influenza B by PCR NEGATIVE NEGATIVE Final    Comment: (NOTE) The Xpert Xpress SARS-CoV-2/FLU/RSV plus assay is intended as an aid in the diagnosis of influenza from Nasopharyngeal swab specimens and should not be used as a sole basis for treatment. Nasal washings and aspirates are  unacceptable for Xpert Xpress SARS-CoV-2/FLU/RSV testing.  Fact Sheet for Patients: EntrepreneurPulse.com.au  Fact Sheet for Healthcare Providers: IncredibleEmployment.be  This test is not yet approved or cleared by the [place] FDA and has been authorized for detection and/or diagnosis of SARS-CoV-2 by FDA under an Emergency Use  Authorization (EUA). This EUA will remain in effect (meaning this test can be used) for the duration of the COVID-19 declaration under Section 564(b)(1) of the Act, 21 U.S.C. section 360bbb-3(b)(1), unless the authorization is terminated or revoked.     Resp Syncytial Virus by PCR POSITIVE (A) NEGATIVE Final    Comment: (NOTE) Fact Sheet for Patients: EntrepreneurPulse.com.au  Fact Sheet for Healthcare Providers: IncredibleEmployment.be  This test is not yet approved or cleared by the [place] FDA and has been authorized for detection and/or diagnosis of SARS-CoV-2 by FDA under an Emergency Use Authorization (EUA). This EUA will remain in effect (meaning this test can be used) for the duration of the COVID-19 declaration under Section 564(b)(1) of the Act, 21 U.S.C. section 360bbb-3(b)(1), unless the authorization is terminated or revoked.  Performed at [facility], [address] [place], [place] [address]      Labs:  CBC: Recent Labs  Lab 10/19/22 1750 10/21/22 0359  WBC 8.3 6.6  NEUTROABS 3.8  --   HGB 13.0 12.2  HCT 38.0 34.9*  MCV 94.1 92.3  PLT 323 275   BMP &GFR Recent Labs  Lab 10/19/22 1750 10/20/22 1002 10/21/22 0359  NA 124* 131* 134*  K 4.2 4.1 4.3  CL 91* 96* 101  CO2 21* 28 29  GLUCOSE 143* 97 115*  BUN '14 9 13  '$ CREATININE 0.91 0.75 0.78  CALCIUM 8.6* 8.9 9.0  MG  --  2.1 2.0  PHOS  --  3.5 3.9   Estimated Creatinine Clearance: 72.4 mL/min (by C-G formula based on SCr of 0.78 mg/dL). Liver & Pancreas: Recent Labs   Lab 10/19/22 1750 10/20/22 1002 10/21/22 0359  AST 25  --   --   ALT 22  --   --   ALKPHOS 79  --   --   BILITOT 0.4  --   --   PROT 6.8  --   --   ALBUMIN 4.1 3.8 3.4*   No results for input(s): "LIPASE", "AMYLASE" in the last 168 hours. No results for input(s): "AMMONIA" in the last 168 hours. Diabetic: No results for input(s): "HGBA1C" in the last 72 hours. Recent Labs  Lab 10/20/22 0750 10/21/22 0457  GLUCAP 95 98   Cardiac Enzymes: Recent Labs  Lab 10/20/22 1002  CKTOTAL 67   No results for input(s): "PROBNP" in the last 8760 hours. Coagulation Profile: No results for input(s): "INR", "PROTIME" in the last 168 hours. Thyroid Function Tests: No results for input(s): "TSH", "T4TOTAL", "FREET4", "T3FREE", "THYROIDAB" in the last 72 hours. Lipid Profile: No results for input(s): "CHOL", "HDL", "LDLCALC", "TRIG", "CHOLHDL", "LDLDIRECT" in the last 72 hours. Anemia Panel: No results for input(s): "VITAMINB12", "FOLATE", "FERRITIN", "TIBC", "IRON", "RETICCTPCT" in the last 72 hours. Urine analysis:    Component Value Date/Time   COLORURINE YELLOW (A) 10/19/2022 1733   APPEARANCEUR CLEAR (A) 10/19/2022 1733   LABSPEC 1.015 10/19/2022 1733   PHURINE 5.0 10/19/2022 1733   GLUCOSEU NEGATIVE 10/19/2022 1733   HGBUR NEGATIVE 10/19/2022 1733   BILIRUBINUR NEGATIVE 10/19/2022 1733   KETONESUR NEGATIVE 10/19/2022 1733   PROTEINUR NEGATIVE 10/19/2022 1733   NITRITE NEGATIVE 10/19/2022 1733   LEUKOCYTESUR NEGATIVE 10/19/2022 1733   Sepsis Labs: Invalid input(s): "PROCALCITONIN", "LACTICIDVEN"   SIGNED:  Mercy Riding, MD  [facility] 10/21/2022, 10:25 PM

## 2022-10-21 NOTE — TOC Initial Note (Signed)
Transition of Care [facility]) - Initial/Assessment Note    Patient Details  Name: Sonia Robinson MRN: [id number] Date of Birth: September 30, 1951  Transition [facility]) CM/SW Contact:    Sonia Ivan, LCSW Phone Number: 10/21/2022, 11:03 AM  Clinical Narrative:    Spoke with daughter Sonia Robinson regarding PT recs for [facility].  Patient lives alone and drives herself to appointments at baseline. Sonia Robinson provides transport if needed.  Patient has DME at home. Patient had [facility] in the past several years ago.  PCP is Dr. Gordy Levan. Pharmacy is [facility].  Patient and daughter are agreeable to HHPT/OT being arranged, no agency preference, confirmed home address. Cory with [facility] accepted referral can do start of care middle of week. Notified Sonia Robinson that [facility] will need to call patient at [phone] for scheduling and she will have to buzz PT into the apartment building.                    Expected Discharge Plan: [place] Barriers to Discharge: Barriers Resolved   Patient Goals and CMS Choice Patient states their goals for this hospitalization and ongoing recovery are:: home with home health CMS Medicare.gov Compare Post Acute Care list provided to:: Patient Represenative (must comment) Choice offered to / list presented to : Patient, Adult Children      Expected Discharge Plan and Services       Living arrangements for the past 2 months: Apartment Expected Discharge Date: 10/21/22                         HH Arranged: PT, OT HH Agency: [facility] Date [facility] Agency Contacted: 10/21/22   Representative spoke with at [place]: Sonia Robinson  Prior Living Arrangements/Services Living arrangements for the past 2 months: Apartment Lives with:: Self Patient language and need for interpreter reviewed:: Yes Do you feel safe going back to the place where you live?: Yes      Need for Family Participation in Patient Care: Yes (Comment) Care giver support system in  place?: Yes (comment) Current home services: DME Criminal Activity/Legal Involvement Pertinent to Current Situation/Hospitalization: No - Comment as needed  Activities of Daily Living Home Assistive Devices/Equipment: Eyeglasses, Gilford Rile (specify type) ADL Screening (condition at time of admission) Patient's cognitive ability adequate to safely complete daily activities?: Yes Is the patient deaf or have difficulty hearing?: No Does the patient have difficulty seeing, even when wearing glasses/contacts?: No Does the patient have difficulty concentrating, remembering, or making decisions?: No Patient able to express need for assistance with ADLs?: Yes Does the patient have difficulty dressing or bathing?: No Independently performs ADLs?: No Does the patient have difficulty walking or climbing stairs?: Yes Weakness of Legs: None Weakness of Arms/Hands: None  Permission Sought/Granted Permission sought to share information with : Facility Art therapist granted to share information with : Yes, Verbal Permission Granted (by daughter)     Permission granted to share info w AGENCY: Home Health agencies        Emotional Assessment         Alcohol / Substance Use: Not Applicable Psych Involvement: No (comment)  Admission diagnosis:  Hyponatremia [E87.1] Altered mental status, unspecified altered mental status type [W40.97] Acute metabolic encephalopathy [D53.29] Patient Active Problem List   Diagnosis Date Noted   Essential hypertension 10/20/2022   Toxic encephalopathy 10/20/2022   Anxiety and depression 10/20/2022   Chronic back pain 10/20/2022  Obesity (BMI 30-39.9) 18/28/8337   Acute metabolic encephalopathy 44/51/4604   Hyponatremia 10/19/2022   Infection due to respiratory syncytial virus (RSV) 10/19/2022   Sinusitis 10/19/2022   Polypharmacy 11/11/2021   Cognitive change 12/02/2015   Chest pain 08/05/2015   Irritable bowel syndrome with both  constipation and diarrhea 12/01/2014   Essential tremor 06/07/2011   Insomnia 03/31/2011   Moderate persistent asthma without complication 79/98/7215   PCP:  Loni Muse, MD Pharmacy:   [place], [place] - [address] [place] [address] Phone: [phone] Fax: [place], [place] - [address] [place] [address] Phone: [phone] Fax: [phone]     Social Determinants of Health ([place]) Social History: [place]: No Food Insecurity (10/21/2022)  Housing: Low Risk  (10/21/2022)  Transportation Needs: No Transportation Needs (10/21/2022)  Utilities: Not At Risk (10/21/2022)  Tobacco Use: Medium Risk (10/19/2022)   SDOH Interventions:     Readmission Risk Interventions     No data to display

## 2022-10-21 NOTE — Plan of Care (Signed)
Patient discharged per MD orders at this time.All dc instructions, education and medications reviewed with the patient.Pt expressed understanding & will comply with dc instructions.f/u appointments was also communicated to the patient.no verbal c/o or any ssx of distress.Pt was discharged home with HH/PT/OT services per order.Pt was transported home by daughter in a privately owned vehicle.

## 2022-10-21 NOTE — Progress Notes (Signed)
Mobility Specialist - Progress Note   10/21/22 1053  Mobility  Activity Ambulated with assistance in hallway  Level of Assistance Standby assist, set-up cues, supervision of patient - no hands on  Assistive Device None  Distance Ambulated (ft) 1120 ft  Activity Response Tolerated well  Mobility Referral Yes  $Mobility charge 1 Mobility   Pt supine in bed on RA upon arrival. Pt STS and ambulates 7 laps around NS SBA. Pt returns to bed with needs in reach.   Sonia Robinson  Mobility Specialist  10/21/22 10:54 AM

## 2022-10-21 NOTE — Plan of Care (Signed)
Patient sleeping between care. Oriented to place, time, and self. Very forgetful. PRN tylenol given for a headache. Room air. Plan of care reviewed with patient. Call bell within reach. Bed alarm on.   Problem: Education: Goal: Knowledge of condition and prescribed therapy will improve Outcome: Progressing   Problem: Cardiac: Goal: Will achieve and/or maintain adequate cardiac output Outcome: Progressing   Problem: Physical Regulation: Goal: Complications related to the disease process, condition or treatment will be avoided or minimized Outcome: Progressing   Problem: Education: Goal: Knowledge of General Education information will improve Description: Including pain rating scale, medication(s)/side effects and non-pharmacologic comfort measures Outcome: Progressing   Problem: Health Behavior/Discharge Planning: Goal: Ability to manage health-related needs will improve Outcome: Progressing   Problem: Clinical Measurements: Goal: Ability to maintain clinical measurements within normal limits will improve Outcome: Progressing Goal: Will remain free from infection Outcome: Progressing Goal: Diagnostic test results will improve Outcome: Progressing Goal: Respiratory complications will improve Outcome: Progressing Goal: Cardiovascular complication will be avoided Outcome: Progressing   Problem: Activity: Goal: Risk for activity intolerance will decrease Outcome: Progressing   Problem: Nutrition: Goal: Adequate nutrition will be maintained Outcome: Progressing   Problem: Coping: Goal: Level of anxiety will decrease Outcome: Progressing   Problem: Elimination: Goal: Will not experience complications related to bowel motility Outcome: Progressing Goal: Will not experience complications related to urinary retention Outcome: Progressing   Problem: Pain Managment: Goal: General experience of comfort will improve Outcome: Progressing   Problem: Safety: Goal: Ability to  remain free from injury will improve Outcome: Progressing   Problem: Skin Integrity: Goal: Risk for impaired skin integrity will decrease Outcome: Progressing

## 2023-01-09 ENCOUNTER — Ambulatory Visit: Payer: 59 | Admitting: Dermatology

## 2023-03-05 ENCOUNTER — Ambulatory Visit (INDEPENDENT_AMBULATORY_CARE_PROVIDER_SITE_OTHER): Payer: 59 | Admitting: Dermatology

## 2023-03-05 VITALS — BP 124/73 | HR 65

## 2023-03-05 DIAGNOSIS — D692 Other nonthrombocytopenic purpura: Secondary | ICD-10-CM | POA: Diagnosis not present

## 2023-03-05 DIAGNOSIS — L820 Inflamed seborrheic keratosis: Secondary | ICD-10-CM

## 2023-03-05 DIAGNOSIS — Z85828 Personal history of other malignant neoplasm of skin: Secondary | ICD-10-CM

## 2023-03-05 DIAGNOSIS — L578 Other skin changes due to chronic exposure to nonionizing radiation: Secondary | ICD-10-CM | POA: Diagnosis not present

## 2023-03-05 DIAGNOSIS — W908XXA Exposure to other nonionizing radiation, initial encounter: Secondary | ICD-10-CM

## 2023-03-05 DIAGNOSIS — X32XXXA Exposure to sunlight, initial encounter: Secondary | ICD-10-CM

## 2023-03-05 DIAGNOSIS — L814 Other melanin hyperpigmentation: Secondary | ICD-10-CM

## 2023-03-05 NOTE — Patient Instructions (Addendum)
Cryotherapy Aftercare  Wash gently with soap and water everyday.   Apply Vaseline and Band-Aid daily until healed.     Due to recent changes in healthcare laws, you may see results of your pathology and/or laboratory studies on MyChart before the doctors have had a chance to review them. We understand that in some cases there may be results that are confusing or concerning to you. Please understand that not all results are received at the same time and often the doctors may need to interpret multiple results in order to provide you with the best plan of care or course of treatment. Therefore, we ask that you please give us 2 business days to thoroughly review all your results before contacting the office for clarification. Should we see a critical lab result, you will be contacted sooner.   If You Need Anything After Your Visit  If you have any questions or concerns for your doctor, please call our main line at [phone] and press option 4 to reach your doctor's medical assistant. If no one answers, please leave a voicemail as directed and we will return your call as soon as possible. Messages left after 4 pm will be answered the following business day.   You may also send us a message via MyChart. We typically respond to MyChart messages within 1-2 business days.  For prescription refills, please ask your pharmacy to contact our office. Our fax number is [phone].  If you have an urgent issue when the clinic is closed that cannot wait until the next business day, you can page your doctor at the number below.    Please note that while we do our best to be available for urgent issues outside of office hours, we are not available 24/7.   If you have an urgent issue and are unable to reach us, you may choose to seek medical care at your doctor's office, retail clinic, urgent care center, or emergency room.  If you have a medical emergency, please immediately call 911 or go to the  emergency department.  Pager Numbers  - Dr. Kowalski: [phone]  - Dr. Moye: [phone]  - Dr. Stewart: [phone]  In the event of inclement weather, please call our main line at [phone] for an update on the status of any delays or closures.  Dermatology Medication Tips: Please keep the boxes that topical medications come in in order to help keep track of the instructions about where and how to use these. Pharmacies typically print the medication instructions only on the boxes and not directly on the medication tubes.   If your medication is too expensive, please contact our office at [phone] option 4 or send us a message through MyChart.   We are unable to tell what your co-pay for medications will be in advance as this is different depending on your insurance coverage. However, we may be able to find a substitute medication at lower cost or fill out paperwork to get insurance to cover a needed medication.   If a prior authorization is required to get your medication covered by your insurance company, please allow us 1-2 business days to complete this process.  Drug prices often vary depending on where the prescription is filled and some pharmacies may offer cheaper prices.  The website www.goodrx.com contains coupons for medications through different pharmacies. The prices here do not account for what the cost may be with help from insurance (it may be cheaper with your insurance), but the website can   give you the price if you did not use any insurance.  - You can print the associated coupon and take it with your prescription to the pharmacy.  - You may also stop by our office during regular business hours and pick up a GoodRx coupon card.  - If you need your prescription sent electronically to a different pharmacy, notify our office through [facility] MyChart or by phone at [phone] option 4.     Si Usted Necesita Algo Despus de Su Visita  Tambin puede  enviarnos un mensaje a travs de MyChart. Por lo general respondemos a los mensajes de MyChart en el transcurso de 1 a 2 das hbiles.  Para renovar recetas, por favor pida a su farmacia que se ponga en contacto con nuestra oficina. Nuestro nmero de fax es el [phone].  Si tiene un asunto urgente cuando la clnica est cerrada y que no puede esperar hasta el siguiente da hbil, puede llamar/localizar a su doctor(a) al nmero que aparece a continuacin.   Por favor, tenga en cuenta que aunque hacemos todo lo posible para estar disponibles para asuntos urgentes fuera del horario de oficina, no estamos disponibles las 24 horas del da, los 7 das de la semana.   Si tiene un problema urgente y no puede comunicarse con nosotros, puede optar por buscar atencin mdica  en el consultorio de su doctor(a), en una clnica privada, en un centro de atencin urgente o en una sala de emergencias.  Si tiene una emergencia mdica, por favor llame inmediatamente al 911 o vaya a la sala de emergencias.  Nmeros de bper  - Dr. Kowalski: [phone]  - Dra. Moye: [phone]  - Dra. Stewart: [phone]  En caso de inclemencias del tiempo, por favor llame a nuestra lnea principal al [phone] para una actualizacin sobre el estado de cualquier retraso o cierre.  Consejos para la medicacin en dermatologa: Por favor, guarde las cajas en las que vienen los medicamentos de uso tpico para ayudarle a seguir las instrucciones sobre dnde y cmo usarlos. Las farmacias generalmente imprimen las instrucciones del medicamento slo en las cajas y no directamente en los tubos del medicamento.   Si su medicamento es muy caro, por favor, pngase en contacto con nuestra oficina llamando al [phone] y presione la opcin 4 o envenos un mensaje a travs de MyChart.   No podemos decirle cul ser su copago por los medicamentos por adelantado ya que esto es diferente dependiendo de la cobertura de su seguro.  Sin embargo, es posible que podamos encontrar un medicamento sustituto a menor costo o llenar un formulario para que el seguro cubra el medicamento que se considera necesario.   Si se requiere una autorizacin previa para que su compaa de seguros cubra su medicamento, por favor permtanos de 1 a 2 das hbiles para completar este proceso.  Los precios de los medicamentos varan con frecuencia dependiendo del lugar de dnde se surte la receta y alguna farmacias pueden ofrecer precios ms baratos.  El sitio web www.goodrx.com tiene cupones para medicamentos de diferentes farmacias. Los precios aqu no tienen en cuenta lo que podra costar con la ayuda del seguro (puede ser ms barato con su seguro), pero el sitio web puede darle el precio si no utiliz ningn seguro.  - Puede imprimir el cupn correspondiente y llevarlo con su receta a la farmacia.  - Tambin puede pasar por nuestra oficina durante el horario de atencin regular y recoger una tarjeta de cupones de GoodRx.  -   Si necesita que su receta se enve electrnicamente a una farmacia diferente, informe a nuestra oficina a travs de MyChart de [place] o por telfono llamando al [phone] y presione la opcin 4.  

## 2023-03-05 NOTE — Progress Notes (Signed)
   Follow-Up Visit   Subjective  Sonia Robinson is a 72 y.o. female who presents for the following: 6 month follow-up history of BCC of the left lateral cheek, excision 07/03/2022. She also has a few new spots she would like checked on her forehead and right neck, picks at and some itchy.   The following portions of the chart were reviewed this encounter and updated as appropriate: medications, allergies, medical history  Review of Systems:  No other skin or systemic complaints except as noted in HPI or Assessment and Plan.  Objective  Well appearing patient in no apparent distress; mood and affect are within normal limits.  A focused examination was performed of the following areas: Face, neck  Relevant exam findings are noted in the Assessment and Plan.  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  - Call if any new or changing lesions are noted between office visits  LENTIGINES Exam: scattered tan macules Due to sun exposure Treatment Plan: Benign-appearing, observe. Recommend daily broad spectrum sunscreen SPF 30+ to sun-exposed areas, reapply every 2 hours as needed.  Call for any changes  Purpura - Chronic; persistent and recurrent.  Treatable, but not curable. - Violaceous macules and patches - Benign - Related to trauma, age, sun damage and/or use of blood thinners, chronic use of topical and/or oral steroids - Observe - Can use OTC arnica containing moisturizer such as Dermend Bruise Formula if desired - Call for worsening or other concerns  Inflamed seborrheic keratosis (3) Right Temple x 1; Right upper neck x 1,  Left forehead x 1  Symptomatic, irritating, patient would like treated.  Destruction of lesion - Right Temple x 1; Right upper neck x 1, Left forehead x 1  Destruction method: cryotherapy   Informed consent: discussed and consent obtained   Lesion destroyed using liquid nitrogen: Yes   Region frozen until ice ball extended beyond lesion: Yes   Outcome: patient tolerated procedure well with no complications   Post-procedure details: wound care instructions given   Additional details:  Prior to procedure, discussed risks of blister formation, small wound, skin dyspigmentation, or rare scar following cryotherapy. Recommend Vaseline ointment to treated areas while healing.   ACTINIC DAMAGE - chronic, secondary to cumulative UV radiation exposure/sun exposure over time - diffuse scaly erythematous macules with underlying dyspigmentation - Recommend daily broad spectrum sunscreen SPF 30+ to sun-exposed areas, reapply every 2 hours as needed.  - Recommend staying in the shade or wearing long sleeves, sun glasses (UVA+UVB protection) and wide brim hats (4-inch brim around the entire circumference of the hat). - Call for new or changing lesions.   Return as scheduled, for TBSE, Hx BCC.  ICherlyn Labella, CMA, am acting as scribe for Willeen Niece, MD .   Documentation: I have reviewed the above documentation for accuracy and completeness, and I agree with the above.  Willeen Niece, MD

## 2023-05-24 ENCOUNTER — Encounter: Payer: Medicare Other | Admitting: Dermatology

## 2023-06-12 ENCOUNTER — Encounter: Payer: Medicare Other | Admitting: Dermatology

## 2023-07-16 ENCOUNTER — Emergency Department
Admission: EM | Admit: 2023-07-16 | Discharge: 2023-07-16 | Disposition: A | Discharge status: Home / Self Care | Payer: 59 | Attending: Emergency Medicine | Admitting: Emergency Medicine

## 2023-07-16 ENCOUNTER — Emergency Department: Payer: 59

## 2023-07-16 ENCOUNTER — Other Ambulatory Visit: Payer: Self-pay

## 2023-07-16 DIAGNOSIS — S20212A Contusion of left front wall of thorax, initial encounter: Secondary | ICD-10-CM | POA: Diagnosis not present

## 2023-07-16 DIAGNOSIS — W1830XA Fall on same level, unspecified, initial encounter: Secondary | ICD-10-CM | POA: Insufficient documentation

## 2023-07-16 DIAGNOSIS — S0990XA Unspecified injury of head, initial encounter: Secondary | ICD-10-CM | POA: Diagnosis present

## 2023-07-16 DIAGNOSIS — W19XXXA Unspecified fall, initial encounter: Secondary | ICD-10-CM

## 2023-07-16 DIAGNOSIS — Y92481 Parking lot as the place of occurrence of the external cause: Secondary | ICD-10-CM | POA: Insufficient documentation

## 2023-07-16 MED ORDER — ACETAMINOPHEN 325 MG PO TABS
650.0000 mg | ORAL_TABLET | Freq: Once | ORAL | Status: AC
  Administered 2023-07-16: 650 mg via ORAL
  Filled 2023-07-16: qty 2

## 2023-07-16 NOTE — ED Provider Notes (Signed)
[facility] Provider Note    Event Date/Time   First MD Initiated Contact with Patient 07/16/23 [phone]     (approximate)   History   Fall   HPI  Sonia Robinson is a 72 y.o. female   is brought to the ED via EMS with complaint of left lateral chest pain after a fall that occurred in the parking lot yesterday.  Patient initially denied hitting her head but does state that she hit the back of her head and is sore.  She has been able to ambulate since that time.  Patient denies any headache, nausea, vomiting, visual changes with her fall.  Patient has a history of hypertension, chronic back pain, essential tremor, IBS and cognitive changes.      Physical Exam   Triage Vital Signs: ED Triage Vitals  Encounter Vitals Group     BP 07/16/23 0342 (!) 127/58     Systolic BP Percentile --      Diastolic BP Percentile --      Pulse Rate 07/16/23 0342 98     Resp 07/16/23 0342 18     Temp 07/16/23 0342 98.3 F (36.8 C)     Temp src --      SpO2 07/16/23 0342 100 %     Weight 07/16/23 0714 199 lb 15.3 oz (90.7 kg)     Height 07/16/23 0714 5\' 4"  (1.626 m)     Head Circumference --      Peak Flow --      Pain Score 07/16/23 0341 8     Pain Loc --      Pain Education --      Exclude from Growth Chart --     Most recent vital signs: Vitals:   07/16/23 0342 07/16/23 0817  BP: (!) 127/58 120/60  Pulse: 98 90  Resp: 18 18  Temp: 98.3 F (36.8 C) 98 F (36.7 C)  SpO2: 100% 98%     General: Awake, no distress.  Alert, answers questions appropriately, cooperative. CV:  Good peripheral perfusion.  Resp:  Normal effort.  Moderate tenderness on palpation of the left lateral ribs but no ecchymosis or deformities noted.  Lungs are clear bilaterally. Abd:  No distention.  Other:  Able move upper and lower extremities without any difficulty.  No tenderness on compression of the hips.   ED Results / Procedures / Treatments   Labs (all labs ordered are  listed, but only abnormal results are displayed) Labs Reviewed - No data to display   RADIOLOGY  Chest x-ray left rib images were reviewed by myself independent of the radiologist and no fracture was noted. CT head and cervical spine per radiologist is negative for acute changes.  PROCEDURES:  Critical Care performed:   Procedures   MEDICATIONS ORDERED IN ED: Medications  acetaminophen (TYLENOL) tablet 650 mg (650 mg Oral Given 07/16/23 0959)     IMPRESSION / MDM / ASSESSMENT AND PLAN / ED COURSE  I reviewed the triage vital signs and the nursing notes.   Differential diagnosis includes, but is not limited to, contusion left ribs, fracture, head injury, contusion, cervical strain, cervical pain, cervical fracture versus subluxation.  72 year old female presents to the ED after a fall that occurred last evening in a parking lot.  Patient states that she has an essential tremor that causes her to occasionally lose her balance.  Patient denies any loss of consciousness but states that she was not with anyone at the  time.  CT head and cervical spine are reassuring and patient was made aware that her x-ray does not show a fracture of her left ribs.  Was given Tylenol while in the emergency department.  Patient is to follow-up with her primary care provider.  She is aware that she should return to the emergency department if any severe worsening of her symptoms or urgent concerns.      Patient's presentation is most consistent with acute illness / injury with system symptoms.  FINAL CLINICAL IMPRESSION(S) / ED DIAGNOSES   Final diagnoses:  Minor head injury, initial encounter  Contusion of ribs, left, initial encounter  Fall, initial encounter     Rx / DC Orders   ED Discharge Orders     None        Note:  This document was prepared using Dragon voice recognition software and may include unintentional dictation errors.   Tommi Rumps, PA-C 07/16/23 1525     Trinna Post, MD 07/17/23 [phone]

## 2023-07-16 NOTE — Discharge Instructions (Signed)
Follow-up with your primary care if any continued problems.  Turn to the emergency department if any severe worsening of your symptoms or urgent concerns.  Continue taking your regular medication.  You may take Tylenol as needed and also apply ice to your ribs as needed for discomfort.

## 2023-07-16 NOTE — ED Triage Notes (Addendum)
Pt to ED via [facility] from home c/o left side pain and bilateral rib pain from a fall yesterday afternoon. Pt reports she has a hx of tremors which impacts her gait and causes her to fall. Pt lost her balance and fell, landing on her left side. Pt denies hitting her head, no LOC, no blood thinners.  145/68 53HR 100%RA 18RR

## 2023-07-17 ENCOUNTER — Ambulatory Visit: Payer: 59 | Admitting: Dermatology

## 2023-07-17 ENCOUNTER — Encounter: Payer: Self-pay | Admitting: Dermatology

## 2023-07-17 DIAGNOSIS — Z1283 Encounter for screening for malignant neoplasm of skin: Secondary | ICD-10-CM

## 2023-07-17 DIAGNOSIS — W908XXA Exposure to other nonionizing radiation, initial encounter: Secondary | ICD-10-CM

## 2023-07-17 DIAGNOSIS — L578 Other skin changes due to chronic exposure to nonionizing radiation: Secondary | ICD-10-CM

## 2023-07-17 DIAGNOSIS — L821 Other seborrheic keratosis: Secondary | ICD-10-CM

## 2023-07-17 DIAGNOSIS — L814 Other melanin hyperpigmentation: Secondary | ICD-10-CM | POA: Diagnosis not present

## 2023-07-17 DIAGNOSIS — D225 Melanocytic nevi of trunk: Secondary | ICD-10-CM

## 2023-07-17 DIAGNOSIS — R238 Other skin changes: Secondary | ICD-10-CM

## 2023-07-17 DIAGNOSIS — D229 Melanocytic nevi, unspecified: Secondary | ICD-10-CM

## 2023-07-17 DIAGNOSIS — D1801 Hemangioma of skin and subcutaneous tissue: Secondary | ICD-10-CM

## 2023-07-17 DIAGNOSIS — Z85828 Personal history of other malignant neoplasm of skin: Secondary | ICD-10-CM

## 2023-07-17 DIAGNOSIS — D692 Other nonthrombocytopenic purpura: Secondary | ICD-10-CM

## 2023-07-17 NOTE — Patient Instructions (Signed)
 Recommend daily broad spectrum sunscreen SPF 30+ to sun-exposed areas, reapply every 2 hours as needed. Call for new or changing lesions.  Staying in the shade or wearing long sleeves, sun glasses (UVA+UVB protection) and wide brim hats (4-inch brim around the entire circumference of the hat) are also recommended for sun protection.    Melanoma ABCDEs  Melanoma is the most dangerous type of skin cancer, and is the leading cause of death from skin disease.  You are more likely to develop melanoma if you: Have light-colored skin, light-colored eyes, or red or blond hair Spend a lot of time in the sun Tan regularly, either outdoors or in a tanning bed Have had blistering sunburns, especially during childhood Have a close family member who has had a melanoma Have atypical moles or large birthmarks  Early detection of melanoma is key since treatment is typically straightforward and cure rates are extremely high if we catch it early.   The first sign of melanoma is often a change in a mole or a new dark spot.  The ABCDE system is a way of remembering the signs of melanoma.  A for asymmetry:  The two halves do not match. B for border:  The edges of the growth are irregular. C for color:  A mixture of colors are present instead of an even brown color. D for diameter:  Melanomas are usually (but not always) greater than 6mm - the size of a pencil eraser. E for evolution:  The spot keeps changing in size, shape, and color.  Please check your skin once per month between visits. You can use a small mirror in front and a large mirror behind you to keep an eye on the back side or your body.   If you see any new or changing lesions before your next follow-up, please call to schedule a visit.  Please continue daily skin protection including broad spectrum sunscreen SPF 30+ to sun-exposed areas, reapplying every 2 hours as needed when you're outdoors.   Staying in the shade or wearing long sleeves, sun  glasses (UVA+UVB protection) and wide brim hats (4-inch brim around the entire circumference of the hat) are also recommended for sun protection.    Due to recent changes in healthcare laws, you may see results of your pathology and/or laboratory studies on MyChart before the doctors have had a chance to review them. We understand that in some cases there may be results that are confusing or concerning to you. Please understand that not all results are received at the same time and often the doctors may need to interpret multiple results in order to provide you with the best plan of care or course of treatment. Therefore, we ask that you please give [place] 2 business days to thoroughly review all your results before contacting the office for clarification. Should we see a critical lab result, you will be contacted sooner.   If You Need Anything After Your Visit  If you have any questions or concerns for your doctor, please call our main line at [phone] and press option 4 to reach your doctor's medical assistant. If no one answers, please leave a voicemail as directed and we will return your call as soon as possible. Messages left after 4 pm will be answered the following business day.   You may also send [place] a message via MyChart. We typically respond to MyChart messages within 1-2 business days.  For prescription refills, please ask your pharmacy to contact our  office. Our fax number is [phone].  If you have an urgent issue when the clinic is closed that cannot wait until the next business day, you can page your doctor at the number below.    Please note that while we do our best to be available for urgent issues outside of office hours, we are not available 24/7.   If you have an urgent issue and are unable to reach [place], you may choose to seek medical care at your doctor's office, retail clinic, urgent care center, or emergency room.  If you have a medical emergency, please immediately call  911 or go to the emergency department.  Pager Numbers  - Dr. Gwen Pounds: [phone]  - Dr. Roseanne Reno: [phone]  - Dr. Katrinka Blazing: [phone]   In the event of inclement weather, please call our main line at [phone] for an update on the status of any delays or closures.  Dermatology Medication Tips: Please keep the boxes that topical medications come in in order to help keep track of the instructions about where and how to use these. Pharmacies typically print the medication instructions only on the boxes and not directly on the medication tubes.   If your medication is too expensive, please contact our office at [phone] option 4 or send [place] a message through MyChart.   We are unable to tell what your co-pay for medications will be in advance as this is different depending on your insurance coverage. However, we may be able to find a substitute medication at lower cost or fill out paperwork to get insurance to cover a needed medication.   If a prior authorization is required to get your medication covered by your insurance company, please allow [place] 1-2 business days to complete this process.  Drug prices often vary depending on where the prescription is filled and some pharmacies may offer cheaper prices.  The website www.goodrx.com contains coupons for medications through different pharmacies. The prices here do not account for what the cost may be with help from insurance (it may be cheaper with your insurance), but the website can give you the price if you did not use any insurance.  - You can print the associated coupon and take it with your prescription to the pharmacy.  - You may also stop by our office during regular business hours and pick up a GoodRx coupon card.  - If you need your prescription sent electronically to a different pharmacy, notify our office through [facility] or by phone at [phone] option 4.     Si Usted Necesita Algo Despus de Su  Visita  Tambin puede enviarnos un mensaje a travs de Clinical cytogeneticist. Por lo general respondemos a los mensajes de MyChart en el transcurso de 1 a 2 das hbiles.  Para renovar recetas, por favor pida a su farmacia que se ponga en contacto con nuestra oficina. Annie Sable de fax es [place] [phone].  Si tiene un asunto urgente cuando la clnica est cerrada y que no puede esperar hasta el siguiente da hbil, puede llamar/localizar a su doctor(a) al nmero que aparece a continuacin.   Por favor, tenga en cuenta que aunque hacemos todo lo posible para estar disponibles para asuntos urgentes fuera del horario de [place], no estamos disponibles las 24 horas del da, los 7 [address]  [address] de la [place].   Si tiene un problema urgente y no puede comunicarse con nosotros, puede optar por buscar atencin mdica  en el consultorio de su doctor(a), en una clnica privada,  en un centro de atencin urgente o en una sala de emergencias.  Si tiene Engineer, drilling, por favor llame inmediatamente al 911 o vaya a la sala de emergencias.  Nmeros de bper  - Dr. Gwen Pounds: [phone]  - Dra. Roseanne Reno: [phone]  - Dr. Katrinka Blazing: [phone]   En caso de inclemencias del tiempo, por favor llame a Lacy Duverney principal al [phone] para una actualizacin sobre el [place] de cualquier retraso o cierre.  Consejos para la medicacin en dermatologa: Por favor, guarde las cajas en las que vienen los medicamentos de uso tpico para ayudarle a seguir las instrucciones sobre dnde y cmo usarlos. Las farmacias generalmente imprimen las instrucciones del medicamento slo en las cajas y no directamente en los tubos del [place].   Si su medicamento es muy caro, por favor, pngase en contacto con Rolm Gala llamando al [phone] y presione la opcin 4 o envenos un mensaje a travs de Clinical cytogeneticist.   No podemos decirle cul ser su copago por los medicamentos por adelantado ya que esto es diferente dependiendo de  la cobertura de su seguro. Sin embargo, es posible que podamos encontrar un medicamento sustituto a Audiological scientist un formulario para que el seguro cubra el medicamento que se considera necesario.   Si se requiere una autorizacin previa para que su compaa de seguros [place] su medicamento, por favor permtanos de 1 a 2 das hbiles para completar [address]  Los precios de los medicamentos varan con frecuencia dependiendo del Environmental consultant de dnde se surte la receta y alguna farmacias pueden ofrecer precios ms baratos.  El sitio web www.goodrx.com tiene cupones para medicamentos de Health and safety inspector. Los precios aqu no tienen en cuenta lo que podra costar con la ayuda del seguro (puede ser ms barato con su seguro), pero el sitio web puede darle el precio si no utiliz Tourist information centre manager.  - Puede imprimir el cupn correspondiente y llevarlo con su receta a la farmacia.  - Tambin puede pasar por nuestra oficina durante el horario de atencin regular y Education officer, museum una tarjeta de cupones de GoodRx.  - Si necesita que su receta se enve electrnicamente a una farmacia diferente, informe a nuestra oficina a travs de MyChart de [place] o por telfono llamando al [phone] y presione la opcin 4.

## 2023-07-17 NOTE — Progress Notes (Signed)
   Follow-Up Visit   Subjective  Sonia Robinson is a 72 y.o. female who presents for the following: Skin Cancer Screening and Full Body Skin Exam. HxBCC.   The patient presents for Total-Body Skin Exam (TBSE) for skin cancer screening and mole check. The patient has spots, moles and lesions to be evaluated, some may be new or changing and the patient may have concern these could be cancer. She has a history of BCC of the left lateral cheek, exc 07/03/22.  The following portions of the chart were reviewed this encounter and updated as appropriate: medications, allergies, medical history  Review of Systems:  No other skin or systemic complaints except as noted in HPI or Assessment and Plan.  Objective  Well appearing patient in no apparent distress; mood and affect are within normal limits.  A full examination was performed including scalp, head, eyes, ears, nose, lips, neck, chest, axillae, abdomen, back, buttocks, bilateral upper extremities, bilateral lower extremities, hands, feet, fingers, toes, fingernails, and toenails. All findings within normal limits unless otherwise noted below.   Relevant physical exam findings are noted in the Assessment and Plan.    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 Excised 07/03/2022. - No evidence of recurrence today - Recommend regular full body skin exams - Recommend daily broad spectrum sunscreen SPF 30+ to sun-exposed areas, reapply every 2 hours as needed.  - Call if any new or changing lesions are noted between office visits  SKIN CANCER SCREENING PERFORMED TODAY.  ACTINIC DAMAGE - Chronic condition, secondary to cumulative UV/sun exposure - diffuse scaly erythematous macules with underlying dyspigmentation - Recommend daily broad spectrum sunscreen SPF 30+ to sun-exposed areas, reapply every 2 hours as needed.  - Staying in the shade or wearing long sleeves, sun glasses (UVA+UVB protection) and  wide brim hats (4-inch brim around the entire circumference of the hat) are also recommended for sun protection.  - Call for new or changing lesions.  LENTIGINES, SEBORRHEIC KERATOSES, HEMANGIOMAS - Benign normal skin lesions - Benign-appearing - Call for any changes  MELANOCYTIC NEVI - Tan-brown and/or pink-flesh-colored symmetric macules and papules - 8.0 x 5.0 mm pink tan papule at the left breast - Benign appearing on exam today - Observation - Call clinic for new or changing moles - Recommend daily use of broad spectrum spf 30+ sunscreen to sun-exposed areas.   LENTIGO vs SEBORRHEIC KERATOSIS VS SCAR Exam: 5.0 mm regular tan macule with lighter center, slightly waxy, stable when compared to previous photo  Removed 10 years ago per patient.  Treatment Plan: Benign-appearing. Stable compared to previous visit. Observation.  Call clinic for new or changing moles.  Recommend daily use of broad spectrum spf 30+ sunscreen to sun-exposed areas.    Nail Purpura/Purpura - Chronic; persistent and recurrent.  Treatable, but not curable. - Violaceous macule of the left index fingernail, patch left upper arm - Benign - Related to trauma, age, sun damage and/or use of blood thinners, chronic use of topical and/or oral steroids - Observe - Can use OTC arnica containing moisturizer such as Dermend Bruise Formula if desired - Call for worsening or other concerns  Return in about 1 year (around 07/16/2024) for TBSE, HxBCC.  ICherlyn Labella, CMA, am acting as scribe for Willeen Niece, MD.   Documentation: I have reviewed the above documentation for accuracy and completeness, and I agree with the above.  Willeen Niece, MD

## 2023-10-05 NOTE — Progress Notes (Signed)
 Internal Medicine Clinic Visit  Reason for visit: Follow Up  A/P:     1. Frequent Falls  Closed fracture of multiple ribs of the left side Reports multiple recent mechanical falls. XR in October showed fractures of the left fourth through seventh anterolateral ribs. She still reports pain and tenderness to palpation over those ribs. Tylenol  has been ineffective. Will trial Voltaren gel and lidocaine  patches for pain. Plan to start working with physical therapy to reduce the risk of falls.  - Physical therapy - Rib binder  - Voltaren gel - Lidocaine  patches   2. Polypharmacy Coordinating with her psychiatrist to wean off medications. Plans to start Klonopin  taper in January. Planning to consider de-prescribing temasepam and Rozerem  as well. She was started on Buspar  as an anxiety alternative and it has been effective.  - Continue Buspar  10 mg  - Klonopin  taper   3. Palpitations She reports new onset palpitations at night and associated with her rib pain. This could be sinus tachycardia associated with pain. Will get EKG in clinic today and placed ZioPatch for 1 week to rule out arrhythmias.  - ECG 12 lead - External ECG-3 days to 7 days (ZIO XT); Future   4. Moderate persistent asthma without complication Asthma symptoms have been well controlled. Recently discontinued Spiriva  since spirometry was not consistent with COPD. No changes in respiratory symptoms. - Continue Advair and albuterol    5. Health Care Maintenance - CT Lung Cancer Screening Baseline or Annual Low Dose; Future - Dexa Bone Density Skeletal; Future - Declined COVID vaccine   We conducted a shared decision-making process. We reviewed benefits and harms of screening, including false positives and potential need for additional diagnostic testing, the possibility of over diagnosis, and total radiation exposure.  We discussed the importance of adhering to annual LDCT screening. We also discussed the impact of  comorbidities on the patient's the ability or willingness to undergo diagsnostic procedure(s) and treatment We discussed the importance of achieving/maintaining abstinence from cigarette smoking. Based on our discussion, we have decided to continue screening.  Return in about 2 months (around 12/06/2023).  Staffed with Dr. Marvis, seen and discussed  __________________________________________________________  HPI:  Her therapist and her friends have told her that she is slow to respond to questions. She does not feel like there is a difference. Her family thinks it may be a side effect of polypharmacy. She has an appointment scheduled with neurology in January.   She notes left lower chest pain for the past 2 weeks. It is tender to palpation. She does report falling twice in the past few weeks. The most recent time, she tripped on her shoe laces. She also reports a fast hear rate and palpitations that is associated with the pain in her side.   She has not been to PT yet because she wants home health PT instead. She is working with her psychiatrist to wean off Klonopin , temasepam, and Rozerem  and they plan to start in January. She has recently started Buspar  as an alternative.   She had a recent sleep study that showed increased movement during sleep. She was on Requip  in the past for RLS, but this was discontinued.   She is interested in repeating her lung cancer screening. She is not interested in a COVID booster.  __________________________________________________________    Medications:  Reviewed in EPIC __________________________________________________________  Physical Exam:  Vital Signs: Vitals:   10/05/23 1309  BP: 114/70  BP Site: L Arm  BP Position: Sitting  BP Cuff Size: Large  Pulse: 60  Resp: 17  Temp: 36.6 C (97.8 F)  TempSrc: Temporal  SpO2: 97%  Weight: 98.4 kg (217 lb)  Height: 162.6 cm (5' 4)      PTHomeBP <redacted file path>  The patient's Average  Home Blood Pressure during the last two weeks is :   /   based on  readings    Gen: Well appearing, NAD CV: RRR, no murmurs Pulm: CTA bilaterally, no crackles or wheezes Abd: Soft, NTND, tenderness to palpation over the left lower ribs Ext: No edema   PHQ-9 Score:   GAD-7 Score:    Medication adherence and barriers to the treatment plan have been addressed. Opportunities to optimize healthy behaviors have been discussed. Patient / caregiver voiced understanding.

## 2024-03-07 NOTE — Progress Notes (Signed)
 [facility] FOLLOW-UP VISIT   Primary care provider:  Marvis Comer Garre, MD [address] [place] [address] [facility] [place] [address]  Referring provider:  Marvis Comer Garre, MD [address] [place] [address] [facility] [place]  [place] [address]   Assessment:   The patient has difficult to manage irritable bowel syndrome.  She is substantially better than she was at the last visit.  She is currently having 3 formed stools each day while taking Linzess 145 mcg.  Her episodes of fecal incontinence have markedly decreased, and appear to be related to stress.  It is possible that the improvement is the result of better control of the constipation, but I also think that she probably improved with the Xifaxan.  I would have a low threshold to repeat Xifaxan in the future.    Because she is rarely having fecal incontinence currently, I would not recommend rectal manometry at present.  I do not think she is currently having heartburn.  She is having belching which is generally behavioral.  Her psychotropic medications are being modified, and I would propose to postpone management of her belching for the time being.  I plan to see her back in 4 months.  Recommendations:   Stay on omeprazole to twice a day.   Stay on Linzess 145 mcg No rectal manometry to measure muscles with probable biofeedback Appointment in 4 months  History:    History of Present Illness  Sonia Robinson is a 73 year old female who returns to clinic for management of irritable bowel syndrome and and heartburn.  I last saw the patient in clinic in January 23, 2024.  At that time she was having issues with bowel irregularity and fecal incontinence.  She reported that she could go a week or so without a bowel movement.  She would feel the urge to defecate but was unable to do so despite prolonged attempts.  When she had bowel movements., they were unformed, resembling thick pudding.  In  addition she was having significant problems with fecal incontinence.  At that time she was using Linzess 72 mcg for management of her constipation, but was still experiencing wide swings between constipation and diarrhea.  She was using Bentyl  and occasional Imodium.  It was my thought that her main problem was constipation with overflow diarrhea.  I increased her dose of Linzess from 72 mcg to 145 mcg, and I treated her with a course of Xifaxan.  Currently, is significantly improved.  Since the last visit she has less cramping, gas, and fewer accidents. Her bowel movements have now become more regular, occurring three times daily, and are described as 'like a banana', not loose or watery. She takes Linzess 145 micrograms daily, which has helped maintain regularity.  Her episodes of fecal incontinence have decreased significantly, though they still occur, particularly when she is anxious or stressed, or if she hasn't taken her medication before eating. She takes Lomotil as needed, especially when going out, approximately twice a week, to prevent accidents. She also uses dicyclomine  four times a day to manage cramping, which has been effective.  Regarding her heartburn, she experiences frequent belching and a sensation of choking rather than burning. She takes omeprazole twice daily but continues to have symptoms. She drinks three glasses of diet soda daily, which may contribute to her belching. She describes the sensation as 'like a choking sensation' followed by a 'big burp' that provides relief.  She has experienced episodes of bright red rectal bleeding associated with diarrhea, which have stopped as her bowel movements have become more regular. Her last colonoscopy was in 2020.  Her weight has been increasing, and she plans to start using SlimFast. She previously lost a significant amount of weight, which was concerning, but it has since increased. She swims regularly at the [facility] participating in  senior lap sessions and family swim times.  Colonoscopy 12/16/2018 Two small polyps -- 7 years   A: Colon, random, biopsy Histologically-unremarkable colonic mucosa  No evidence of microscopic colitis    B: Colon, transverse and descending, biopsy Adenomatous polyp (multiple fragments)    CT of the chest November 04, 2018  negative.     Modified barium swallow 09/04/2018.  Normal.    CT of the abdomen and pelvis September 02, 2018 -  diverticulosis and possible bladder wall thickening.   US  Liver 01/31/2021.  Patent hepatic vasculature with normal flow direction. No evidence of main portal vein thrombosis   CT Chest/abomen 01/30/2021   --Diffuse bladder wall thickening which may be in part due to bladder decompression. Consider correlation with urinalysis if there is concern for acute cystitis. --Left buttock soft tissue fluid collection which could represent an abscess measuring up to 3.6 cm. --Bilateral bowel containing inguinal hernias   CT Abdomen and pelvis 08/27/2020.  There is a tiny fat-containing ventral abdominal hernia with a 0.8 cm fascial defect ( just anterior to the liver.   Medications: Current Outpatient Medications on File Prior to Visit  Medication Sig  . albuterol  HFA 90 mcg/actuation inhaler INHALE 2 PUFFS EVERY 6 HOURS AS NEEDED FOR WHEEZING  . ascorbic acid, vitamin C, (ASCORBIC ACID) 500 MG tablet Take 1 tablet (500 mg total) by mouth daily.  . azelastine  (ASTELIN ) 137 mcg (0.1 %) nasal spray 2 sprays into each nostril two (2) times a day. Use in each nostril as directed  . budesonide -formoterol  (SYMBICORT ) 160-4.5 mcg/actuation inhaler Inhale 2 puffs two (2) times a day.  . busPIRone  (BUSPAR ) 15 MG tablet   . cholecalciferol , vitamin D3, (VITAMIN D3) 2,000 unit cap Take 1 capsule (2,000 Units total) by mouth daily at 0600.  . clonazePAM  (KLONOPIN ) 1 MG tablet Take 0.5 tablets (0.5 mg total) by mouth Three (3) times a day as needed. Weaning down  .  cycloSPORINE  (RESTASIS ) 0.05 % ophthalmic emulsion ADMINISTER 1 DROP TO BOTH EYES 2 TIMES ADAY  . dicyclomine  (BENTYL ) 20 mg tablet USE AS NEEDED FOR DIARRHEA (PATIENT WAS USING 2 TABLETS 4 TIMES DAILY PER LAST RX)  . EPINEPHrine (EPIPEN) 0.3 mg/0.3 mL injection INJECT 0.3 ml (0.3 mg TOTAL) INTO THE MUSCLE ONCE AS NEEDED.  . FLUoxetine  (PROZAC ) 20 MG capsule Take 3 capsules (60 mg total) by mouth daily.  . fluticasone  propionate (FLONASE ) 50 mcg/actuation nasal spray USE 2 SPRAYS INTO EACH NOSTRIL 2 TIMES DAILY  . fluticasone  propionate (FLONASE ) 50 mcg/actuation nasal spray 2 sprays into each nostril two (2) times a day.  . folic acid  (FOLVITE ) 800 MCG tablet Take 1 tablet (800 mcg total) by mouth daily.  SABRA linaclotide (LINZESS) 145 mcg capsule Take 1 capsule on an empty stomach at least 30 minutes prior to a meal at the same time every day.  . lisinopril  (PRINIVIL ,ZESTRIL ) 10 MG tablet TAKE 2 TABLETS BY MOUTH DAILY  . montelukast  (SINGULAIR ) 10 mg tablet  (Patient not taking: Reported on 03/04/2024)  . multivitamin (TAB-A-VITE/THERAGRAN) per tablet Take 1 tablet by mouth daily.  SABRA omega-3/dha/epa/fish oil (  FISH OIL-OMEGA-3 FATTY ACIDS) 300-1,000 mg capsule Take 1 capsule (1 g total) by mouth daily.  SABRA omeprazole (PRILOSEC) 40 MG capsule Take 1 twice a day  . ondansetron  (ZOFRAN ) 4 MG tablet Take 1 tablet (4 mg total) by mouth daily as needed for nausea. (Patient not taking: Reported on 03/04/2024)  . rifAXIMin (XIFAXAN) 550 mg Tab Take 1 tablet (550 mg total) by mouth two (2) times a day. (Patient not taking: Reported on 03/04/2024)  . ROZEREM  8 mg tablet Take 1 tablet (8 mg total) by mouth nightly.  . SPIRIVA  WITH HANDIHALER 18 mcg inhalation capsule  (Patient not taking: Reported on 03/04/2024)  . temAZEpam  (RESTORIL ) 30 mg capsule Take 1 capsule (30 mg total) by mouth nightly.  . TYRVAYA 0.03 mg/spray sprm USE 1 SPRAY INTO EACH NOSTRIL 2 TIMES DAILY   No current facility-administered medications  on file prior to visit.    Allergies:  Augmentin [amoxicillin-pot clavulanate]; Chocolate flavor; Divalproex; Doxycycline; Iodine; Iodine and iodide containing products; Latex, natural rubber; Morphine; Naproxen; Oxycodone; Penicillins; Povidone-iodine; Shellfish containing products; Sulfa (sulfonamide antibiotics); Umeclidinium; Venom-honey bee; Wellbutrin [bupropion hcl]; Fluoxetine ; Motrin [ibuprofen]; Nsaids (non-steroidal anti-inflammatory drug); Amlodipine; Clindamycin; and Melatonin       Physical Exam:    VS: BP 118/81 (BP Site: L Arm, BP Position: Sitting)   Pulse 69   Temp 36.2 C (97.1 F) (Temporal)   Ht 162.6 cm (5' 4)   Wt (!) 108.3 kg (238 lb 12.8 oz)   BMI 40.99 kg/m     CHEST:  Clear to percussion and ausculation. HEART:  Regular rate and rhythm.  No murmurs rubs or gallops ABDOMEN:  Not distended and not tympanitic.  Bowel sounds normal.  No organs or masses palpable.  The abdomen is nontender.   Laboratory: Reviewed in Epic and/or outside records.

## 2024-04-17 NOTE — Progress Notes (Signed)
 I had the pleasure of seeing Ms. Sonia Robinson in consultation at the [facility] in the Department of Neurology at the [facility] of [place] .   Ms. Sonia Robinson is seen at the request of None Per Patient Pcp for evaluation of tremor .    HISTORY OF PRESENT ILLNESS: Ms. Sonia Robinson is a 73 y.o. 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 She is accompanied by daughter who helped provide additional history.  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 Her tremor has been described as involving posturing, twisting, and intermittent jerky movements, including lightning-like myoclonic jerks. She has a strong family history of tremor and comorbid PTSD, depression, and anxiety. She has trialed multiple medications including primidone, Topamax, Klonopin , and gabapentin, but their use was limited by side effects and cognitive concerns. Of note, primidone had previously helped with balance and jerking movements, but was discontinued due to negative effects on mood. DBS was considered in 2005 but deferred due to psychiatric risks. Her treatment plan at that time focused on a conservative approach, emphasizing the use of weighted utensils and assistive tools instead of escalating pharmacologic therapy. She also has sensory ataxia, which had worsened in the past due to polypharmacy, with notable improvement following medication reduction and physical therapy.  Since Last visit with Dr. Curlene on 0318/2024, The patient returns today accompanied by her daughter. She continues to use weighted eating utensils and wrist braces, which help manage tremor during daily tasks. She is currently working with  occupational therapy and following with Dr. Vannie at the sleep clinic, where she is undergoing a gradual taper of clonazepam  (reduced from 1 mg TID to 0.5 mg BID). She reports that this taper has led to improved mental clarity and reduced forgetfulness, as she previously would lose her train of thought mid-sentence. She has experienced an increase in falls recently. She endorses depth perception difficulties and owns a walker, rollator, and cane, though she is reluctant to use them, expressing that they feel "for old people." She also reports jerking movements overall including at night that wake her, as well as a longstanding history of vivid dreams and dream enactment, including walking and talking during sleep. She notes that social embarrassment worsens her jerking.   she denies dysosmia, lightheadedness upon standing from a seated position, issues with bowel or bladder control, speech changes, dysphagia, changes in sweating patterns, episodes of confusion, hallucinations  The patient endorses no known exposure to chemicals/toxins. She denies a history of antipsychotic or antiemetic use.   NEUROIMAGING STUDIES:  07/30/2023 CT Head Wo Contrast  No acute intracranial abnormality.   Outside reports: Available records were personally reviewed by me and details of history and medications changes that were done in the past were noted.   Past Medical History[1] Past Surgical History[2] Family History[3] Social History [4]  Scheduled Meds: Prior to Admission medications  Medication Sig Start Date End Date Taking? Authorizing Provider  albuterol  HFA 90 mcg/actuation inhaler INHALE 2 PUFFS EVERY 6 HOURS AS NEEDED FOR WHEEZING Patient taking differently: once as needed. 10/28/23  Yes Marvis Comer Garre, MD  ascorbic acid, vitamin C, (ASCORBIC ACID) 500 MG tablet Take 1 tablet (500 mg total) by mouth daily.   Yes [provider]  budesonide -formoterol  (SYMBICORT ) 160-4.5 mcg/actuation  inhaler Inhale 2 puffs two (2) times a day. 01/01/24 12/31/24 Yes Marvis Comer Garre, MD  busPIRone  (BUSPAR ) 15 MG tablet  12/04/23  Yes [provider]  cholecalciferol , vitamin D3, (VITAMIN D3) 2,000 unit cap Take 1 capsule (2,000 Units total) by mouth daily at 0600. 03/26/17  Yes Shiela Leita Reusing, MD  clonazePAM  (KLONOPIN ) 1 MG tablet Take 0.5 tablets (0.5 mg total) by mouth two (2) times a day as needed. Weaning down 09/24/18  Yes [provider]  cycloSPORINE  (RESTASIS ) 0.05 % ophthalmic emulsion ADMINISTER 1 DROP TO BOTH EYES 2 TIMES ADAY 03/18/24  Yes Klifto, Hoy Part, MD  dicyclomine  (BENTYL ) 20 mg tablet USE AS NEEDED FOR DIARRHEA (PATIENT WAS USING 2 TABLETS 4 TIMES DAILY PER LAST RX) 04/16/24  Yes Jesse Lamar Savant, MD  diphenoxylate-atropine (LOMOTIL) 2.5-0.025 mg per tablet Take 1 tablet by mouth four (4) times a day as needed for diarrhea. 03/19/24 09/15/24 Yes Jesse Lamar Savant, MD  FLUoxetine  (PROZAC ) 20 MG capsule Take 3 capsules (60 mg total) by mouth daily. 03/14/23  Yes Marvis Comer Garre, MD  fluticasone  propionate (FLONASE ) 50 mcg/actuation nasal spray 2 sprays into each nostril two (2) times a day. 11/21/23 11/20/24 Yes Martyn Redell Lenis, MD  linaclotide LARUE) 145 mcg capsule Take 1 capsule on an empty stomach at least 30 minutes prior to a meal at the same time every day. 01/23/24  Yes Jesse Lamar Savant, MD  lisinopril  (PRINIVIL ,ZESTRIL ) 10 MG tablet TAKE 2 TABLETS BY MOUTH DAILY 12/19/23  Yes Beverley Clotilda Planas, MD  multivitamin (TAB-A-VITE/THERAGRAN) per tablet Take 1 tablet by mouth daily.   Yes [provider]  omega-3/dha/epa/fish oil (FISH OIL-OMEGA-3 FATTY ACIDS) 300-1,000 mg capsule Take 1 capsule (1 g total) by mouth daily.   Yes [provider]  omeprazole (PRILOSEC) 40 MG capsule Take 1 twice a day 01/23/24  Yes Jesse Lamar Savant, MD  ondansetron  (ZOFRAN ) 4 MG tablet Take 1 tablet (4 mg total) by mouth  daily as needed for nausea. 08/03/23 08/02/24 Yes Marvis Comer Garre, MD  temAZEpam  (RESTORIL ) 30 mg capsule Take 1 capsule (30 mg total) by mouth nightly. 08/13/14  Yes [provider]  TYRVAYA 0.03 mg/spray sprm USE 1 SPRAY INTO EACH NOSTRIL 2 TIMES DAILY 10/26/23  Yes Evonne Landry Commons, MD  azelastine  (ASTELIN ) 137 mcg (0.1 %) nasal spray 2 sprays into each nostril two (2) times a day. Use in each nostril as directed 11/21/23   Martyn Redell Lenis, MD  EPINEPHrine (EPIPEN) 0.3 mg/0.3 mL injection INJECT 0.3 ml (0.3 mg TOTAL) INTO THE MUSCLE ONCE AS NEEDED. Patient not taking: Reported on 04/17/2024 06/29/20   Coletti, Chiquita Sine, MD  folic acid  (FOLVITE ) 800 MCG tablet Take 1 tablet (800 mcg total) by mouth daily.    [provider]  ROZEREM  8 mg tablet Take 1 tablet (8 mg total) by mouth nightly. 03/17/21   [provider]    Allergies[5]  Review of Systems:  12  Systems were reviewed and were negative except for pertinent items noted in the HPI.   Objective: Vital signs in last 24 hours: BP 111/77 (BP Site: L Arm, BP Position: Standing, BP Cuff Size: Large)   Pulse 77   Temp 35.7 C (96.2 F) (Temporal)   Resp 17   Ht 162.6 cm (5' 4)   Wt (!) 108.5 kg (239 lb 3.2 oz)   BMI 41.06 kg/m     Assessment/Plan: In summary, Ms. Sonia Robinson is a 73 y.o.  left handed female who presents for tremor. 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 Her tremor has been described as involving posturing, twisting, and intermittent jerky movements, including lightning-like myoclonic jerks. She has a strong family history of tremor and comorbid PTSD, depression, and anxiety. She has trialed multiple medications including primidone, Topamax, Klonopin , and gabapentin, but their use was limited by side effects and cognitive concerns. Of note, primidone had previously helped with  balance and jerking movements, but was discontinued due to negative effects on mood. DBS was considered in 2005 but deferred due to psychiatric risks.  On examination, she exhibits mild jerking movements with certain positions, but there are no definitive findings concerning for Parkinson's disease at this time. As she is currently in the process of weaning off Clonazepam , we will defer any medication changes until her regimen stabilizes. No new medication will be initiated at this time to avoid confounding effects during this transition. Primidone may be reconsidered in the future if symptoms progress or become more functionally limiting. We will continue to monitor how her symptoms progress over time. Follow up at scheduled visit.   - No new medication at this point. - Primidone in the future.  - Follow up at scheduled visit.   It was a pleasure meeting Ms. Kimberly Richardson Robinson. I will see her in follow up at scheduled visit.   Thank you for allowing me to participate in the care of your patient. Please do not hesitate to contact us  with any further questions or concerns regarding her care. My phone number is [phone].  Health education/Health literacy: Patient education was provided. Reviewed the differential diagnosis, plan of care in details as well as other elements as details above. The benefits and risk of each procedure and medications were discussed with the patient. Alternative options were reviewed with the patient.   Total face - to - face time included 40 minutes > 50 % in direct consultation reviewing details of history, examination and data findings , discussing my clinical reasoning and clinical impression, as well as a review of my recommendations for a plan of treatment as documented above. Additional time was provided to address alternative options of care, health literacy regarding the differential diagnosis, testing and medication options, the benefits of current plan as well  as an opportunity to address all the patient's questions and concerns.   A written summary was provided to the patient at the time of the visit, as well as instructions on how to access My Chart. My contact information was provided along with instructions on how to reach the administrative office and the clinic.  Hokuto Morita, MD [facility] [place] , [place]  Scribe's Attestation: Sanjuan Norway, MD obtained and performed the history, physical exam and medical decision making elements that were entered into the chart. Signed by Darryll Medin, Scribe, on April 17, 2024 at 12:05 PM.  ---------------------------------------------------------------------------------------------------------------------- April 17, 2024 12:43 PM. Documentation assistance provided by the Scribe. I was present during the time the encounter was recorded. The information recorded by the Scribe was done at my direction and has been reviewed and validated by me.  Sanjuan Norway, MD, MD ----------------------------------------------------------------------------------------------------------------------        [1] Past Medical History: Diagnosis Date  . Acid reflux   . Allergic   . Allergic rhinitis   . Anisocoria   . Anxiety   . Arthritis  osteoarthritis  . Asthma (HHS-HCC)   . At risk for falls   . Basal cell carcinoma   . COPD (chronic obstructive pulmonary disease)      . Cortical senile cataract    Cortical senile cataract, bilateral  . Crystalline deposits in vitreous   . Depression   . Dry eyes    MGD (meibomian gland disease), unspecified laterality  . Endometriosis   . Essential tremor   . Fibromyalgia   . Financial difficulties   . Hearing impairment    Hearing loss in both ears and ringing in left ear  . Heart palpitations   . History of MRSA infection of lungs 1999  . HL (hearing loss)   . Hyperlipidemia   .  Impaired mobility   . Inadequate social support   . Irritable bowel syndrome   . Mild cognitive disorder 09/01/2021  . Obesity   . OSA on CPAP   . Panic attacks   . Parasomnia   . Primary hypertension 12/19/2021  . PTSD (post-traumatic stress disorder)   . Raynaud's disease /phenomenon   . Sinusitis   . Sleep apnea   . 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 Tear film insufficiency, unspecified   . Visual impairment    had cataracts in both eyes   [2] Past Surgical History: Procedure Laterality Date  . adnoidectomy    . CARPAL TUNNEL RELEASE     bilateral  . CHOLECYSTECTOMY    . FRACTURE SURGERY Left    3 pins in left hip  . HAND SURGERY    . hip dysplasia    . HIP PINNING Left   . HYSTERECTOMY  1998   For Endometriosis  . JOINT REPLACEMENT    . OOPHORECTOMY Bilateral    1998  . PR ANTERIOR COLPORRAPHY RPR CYSTOCELE W/CYSTO Midline 09/11/2016   Procedure: ANTERIOR COLPORRHAPHY, REPAIR OF CYSTOCELE WITH OR WITHOUT REPAIR OF URETHROCELE;  Surgeon: Saul Blowers, MD;  [facility];  Service: Pelvic Health  . PR COLONOSCOPY W/BIOPSY SINGLE/MULTIPLE N/A 12/16/2018   Procedure: COLONOSCOPY, FLEXIBLE, PROXIMAL TO SPLENIC FLEXURE; WITH BIOPSY, SINGLE OR MULTIPLE;  Surgeon: Lamar Jayson Ards, MD;  [facility];  Service: Gastroenterology  . PR CYSTOURETHROSCOPY N/A 08/31/2015   Procedure: CYSTOURETHROSCOPY (SEPARATE PROCEDURE);  Surgeon: Annamarie Connolly, MD;  [facility]: [facility] OR [facility];  Service: Pelvic Health  . PR CYSTOURETHROSCOPY Midline 09/11/2016   Procedure: CYSTOURETHROSCOPY (SEPARATE PROCEDURE);  Surgeon: Annamarie Connolly, MD;  Location: [facility] OR [facility];  Service: Pelvic Health  . PR DESTRUCT INTERNAL HEMORRHOID, THERMAL N/A 01/07/2014   Procedure: DESTRUCTION OF INTERNAL HEMORRHOID(S) BY THERMAL ENERGY;  Surgeon: Donna LITTIE Aland, MD;  Location: [facility];   Service: Gastroenterology  . PR DESTRUCT INTERNAL HEMORRHOID, THERMAL N/A 04/15/2014   Procedure: DESTRUCTION OF INTERNAL HEMORRHOID(S) BY THERMAL ENERGY;  Surgeon: Donna LITTIE Aland, MD;  Location: [facility];  Service: Gastroenterology  . PR DESTRUCT INTERNAL HEMORRHOID, THERMAL N/A 07/20/2014   Procedure: DESTRUCTION OF INTERNAL HEMORRHOID(S) BY THERMAL ENERGY;  Surgeon: Donna LITTIE Aland, MD;  Location: [facility];  Service: Gastroenterology  . PR EXC TUMOR SOFT TISSUE ABDOMINAL WALL SUBQ 3+CM Midline 06/04/2017   Procedure: EXCISION, TUMOR, SOFT TISSUE OF ABDOMINAL WALL, SUBCUTANEOUS; 3 CM OR GREATER;  Surgeon: Evalene Karalee Shilling, MD;  Location: MAIN OR [facility];  Service: Gastrointestinal  . PR INCISION & DRAINAGE ABSCESS COMPLICATED/MULTIPLE Right 12/02/2014   Procedure: INCISION &  DRAINAGE OF ABSCESS; COMPLICATED OR MULTIPLE FOOT;  Surgeon: Kayla FORBES Setters, DPM;  Location: [facility];  Service: Vascular  . PR REMV FOOT FOREIGN BODY,DEEP Right 12/02/2014   Procedure: REMOV FB FT; DEEP;  Surgeon: Kayla FORBES Setters, DPM;  Location: [facility] OR [facility];  Service: Vascular  . PR REVAGINAL PROLAPSE,SACROSP LIG Bilateral 08/31/2015   Procedure: CULPOPEXY, VAGINAL; EXTRA-PERITONEAL APPROACH (SACROSPINOUS, ILIOCOCCYGEUS);  Surgeon: Annamarie Connolly, MD;  [facility]: [facility] OR [facility];  Service: Pelvic Health  . PR SLING OPER STRES INCONTINENCE Midline 09/11/2016   Procedure: SLING OPERATION FOR STRESS INCONTINENCE (EG, FASCIA OR SYNTHETIC);  Surgeon: Annamarie Connolly, MD;  [facility];  Service: Pelvic Health  . SKIN BIOPSY    . TONSILLECTOMY    . TUBAL LIGATION    [3] Family History Problem Relation Age of Onset  . Alcohol abuse Brother   . Alcohol abuse Brother   . Hypertension Brother   . Tremor Son        1st decade of life  . Tremor Daughter        1st decade of life  . Restless legs syndrome Daughter   . Diabetes Mother   .  Hypertension Mother   . Alcohol abuse Mother   . Melanoma Mother   . Kidney disease Mother   . Diabetes Sister   . Hypertension Sister   . Alcohol abuse Sister   . Melanoma Sister   . Diabetes Maternal Grandmother   . Hypertension Maternal Grandmother   . Asthma Maternal Grandmother   . Glaucoma Maternal Grandmother   . Melanoma Maternal Grandmother   . Ovarian cancer Maternal Grandmother   . Diabetes Maternal Grandfather   . Hypertension Maternal Grandfather   . Asthma Maternal Grandfather   . Colon cancer Maternal Grandfather   . Diabetes Paternal Grandmother   . Hypertension Paternal Grandmother   . Asthma Paternal Grandmother   . Diabetes Paternal Grandfather   . Hypertension Paternal Grandfather   . Asthma Paternal Grandfather   . Glaucoma Maternal Aunt   . Diabetes Maternal Aunt   . No Known Problems Father   . No Known Problems Maternal Uncle   . No Known Problems Paternal Aunt   . No Known Problems Paternal Uncle   . Breast cancer Maternal Aunt   . Diabetes Maternal Aunt   . Kidney disease Maternal Aunt   . Diabetes Maternal Aunt   . Liver disease Brother   . Alcohol abuse Brother   . Pancreatic cancer Brother   . No Known Problems Other   . Anesthesia problems Neg Hx   . Broken bones Neg Hx   . Clotting disorder Neg Hx   . Collagen disease Neg Hx   . Dislocations Neg Hx   . Fibromyalgia Neg Hx   . Gout Neg Hx   . Hemophilia Neg Hx   . Osteoporosis Neg Hx   . Rheumatologic disease Neg Hx   . Scoliosis Neg Hx   . Severe sprains Neg Hx   . Sickle cell anemia Neg Hx   . Spinal Compression Fracture Neg Hx   . Endometrial cancer Neg Hx   . Basal cell carcinoma Neg Hx   . Squamous cell carcinoma Neg Hx   . Amblyopia Neg Hx   . Blindness Neg Hx   . Cancer Neg Hx   . Cataracts Neg Hx   . Macular degeneration Neg Hx   . Retinal detachment Neg Hx   . Strabismus Neg Hx   .  Stroke Neg Hx   . Thyroid disease Neg Hx   [4] Social History Socioeconomic  History  . Marital status: Divorced  . Number of children: 2  . 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 Smoking status: Former    Current packs/day: 0.00    Average packs/day: 1 pack/day for 45.0 years (45.0 ttl pk-yrs)    Types: Cigarettes    Start date: 10/03/1965    Quit date: 06/26/2010    Years since quitting: 13.8  . Smokeless tobacco: Never  Vaping Use  . Vaping status: Never Used  Substance and Sexual Activity  . Alcohol use: Not Currently    Alcohol/week: 3.0 standard drinks of alcohol    Types: 3 Glasses of wine per week    Comment: occasionally  . Drug use: Not Currently  . Sexual activity: Not Currently    Partners: Male  Other Topics Concern  . Do you use sunscreen? Yes  . Tanning bed use? No  . Are you easily burned? No  . Excessive sun exposure? Yes  . Blistering sunburns? Yes  Social History Narrative   She is on disability. Pt now lives at [facility] Assisted living Facility. She has a daughter nearby.   Social Drivers of Health   Financial Resource Strain: Medium Risk (03/14/2023)   Overall Financial Resource Strain (CARDIA)   . Difficulty of Paying Living Expenses: Somewhat hard  Food Insecurity: No Food Insecurity (03/14/2023)   Hunger Vital Sign   . Worried About Programme researcher, broadcasting/film/video in the Last Year: Never true   . Ran Out of Food in the Last Year: Never true  Transportation Needs: No Transportation Needs (03/14/2023)   PRAPARE - Transportation   . Lack of Transportation (Medical): No   . Lack of Transportation (Non-Medical): No  Physical Activity: Inactive (12/28/2020)   Exercise Vital Sign   . Days of Exercise per Week: 0 days   . Minutes of Exercise per Session: 0 min  Stress: Stress Concern Present (12/19/2021)   [employer] of Occupational Health - Occupational Stress Questionnaire   . Feeling of Stress : Rather much   Social Connections: Socially Isolated (12/28/2020)   Social Connection and Isolation Panel   . Frequency of Communication with Friends and Family: Once a week   . Frequency of Social Gatherings with Friends and Family: Never   . Attends Religious Services: Never   . Active Member of Clubs or Organizations: No   . Attends Banker Meetings: Never   . Marital Status: Divorced  Housing: Low Risk  (03/14/2023)   Housing   . Within the past 12 months, have you ever stayed: outside, in a car, in a tent, in an overnight shelter, or temporarily in someone else's home (i.e. couch-surfing)?: No   . Are you worried about losing your housing?: No  [5] Allergies Allergen Reactions  . Augmentin [Amoxicillin-Pot Clavulanate] Hives, Swelling and Rash  . Chocolate Flavor Hives and Itching  . Divalproex Hives and Nausea And Vomiting  . Doxycycline Swelling and Rash    Reports lip swelling and rash  . Iodine Rash, Shortness Of Breath and Swelling    Tongue swelling  . Iodine And Iodide Containing Products Shortness Of Breath, Swelling and Rash    Tongue swelling   . Latex, Natural Rubber Hives, Itching, Swelling and Rash    Lip swelling  .  Morphine Anaphylaxis and Swelling    Losing consciousness and lips swelling and tongue swelling.  . Naproxen Swelling  . Oxycodone Anaphylaxis, Hives, Itching and Swelling    Lip swelling  Lip swelling    Lips became swollen  . Penicillins Hives, Itching, Swelling and Rash  . Povidone-Iodine Rash, Shortness Of Breath and Swelling    Tongue swelling  . Shellfish Containing Products Hives, Diarrhea, Itching, Nausea And Vomiting, Swelling and Rash    ALL SHELL FISH  . Sulfa (Sulfonamide Antibiotics) Hives, Itching, Swelling and Anaphylaxis  . Umeclidinium Other (See Comments), Palpitations and Swelling    Had tongue swelling and burning sensation in throat / tongue  Had tongue swelling and burning sensation in throat / tongue    Incruse Ellipta-  Had tongue swelling and burning sensation in throat/tongue  . Venom-Honey Bee Anaphylaxis    Including wasps  . Wellbutrin [Bupropion Hcl] Swelling  . Fluoxetine      SSRI's affect patients sodium level.  Other Reaction(s): Unknown  . Motrin [Ibuprofen] Other (See Comments)    irritates IBS ended up with an ulcer   . Nsaids (Non-Steroidal Anti-Inflammatory Drug) Other (See Comments)    Marked GI upset  . Amlodipine Rash  . Clindamycin Rash  . Melatonin Itching and Rash    [facility] brand melatonin caused itching and rash. Has tried another over the counter brand since and has had no reactions.

## 2024-05-10 ENCOUNTER — Emergency Department: Admission: EM | Admit: 2024-05-10 | Discharge: 2024-05-10 | Disposition: A | Discharge status: Home / Self Care

## 2024-05-10 ENCOUNTER — Emergency Department

## 2024-05-10 ENCOUNTER — Other Ambulatory Visit: Payer: Self-pay

## 2024-05-10 DIAGNOSIS — M25551 Pain in right hip: Secondary | ICD-10-CM | POA: Diagnosis not present

## 2024-05-10 DIAGNOSIS — J45909 Unspecified asthma, uncomplicated: Secondary | ICD-10-CM | POA: Diagnosis not present

## 2024-05-10 DIAGNOSIS — I10 Essential (primary) hypertension: Secondary | ICD-10-CM | POA: Insufficient documentation

## 2024-05-10 DIAGNOSIS — R519 Headache, unspecified: Secondary | ICD-10-CM | POA: Insufficient documentation

## 2024-05-10 DIAGNOSIS — S99911A Unspecified injury of right ankle, initial encounter: Secondary | ICD-10-CM | POA: Diagnosis present

## 2024-05-10 DIAGNOSIS — M25561 Pain in right knee: Secondary | ICD-10-CM | POA: Insufficient documentation

## 2024-05-10 DIAGNOSIS — S90511A Abrasion, right ankle, initial encounter: Secondary | ICD-10-CM | POA: Diagnosis not present

## 2024-05-10 DIAGNOSIS — M25571 Pain in right ankle and joints of right foot: Secondary | ICD-10-CM

## 2024-05-10 MED ORDER — ONDANSETRON 4 MG PO TBDP
4.0000 mg | ORAL_TABLET | Freq: Once | ORAL | Status: AC
  Administered 2024-05-10: 4 mg via ORAL
  Filled 2024-05-10: qty 1

## 2024-05-10 MED ORDER — HYDROCODONE-ACETAMINOPHEN 7.5-325 MG/15ML PO SOLN: 10.0000 mL | Freq: Four times a day (QID) | ORAL | 0 refills | Status: AC | PRN

## 2024-05-10 MED ORDER — HYDROCODONE-ACETAMINOPHEN 5-325 MG PO TABS
1.0000 | ORAL_TABLET | Freq: Once | ORAL | Status: AC
  Administered 2024-05-10: 1 via ORAL
  Filled 2024-05-10: qty 1

## 2024-05-10 NOTE — ED Notes (Signed)
 Pulse is present in the right foot, which is the ankle she is complaining of with pain, also has appropriate color and cap refill
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
## 2024-05-10 NOTE — ED Notes (Signed)
 Pt was able to ambulate to the bathroom with minimal assistance with a CAM boot on
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
## 2024-06-30 ENCOUNTER — Telehealth: Payer: Self-pay | Admitting: Psychiatry

## 2024-06-30 NOTE — Telephone Encounter (Signed)
 PT had a NEWPT appt scheduled for Sept. Due to provider being out of office, appt was rescheduled for 08/13/24. PT stated she will be out of meds. I advised PT to contact her previous office, the provider who took over meds after Dr. Daniel left. I also advised her to also contact her PCP or she would have to go to nearest ED to avoid withdrawal symptoms. She understood.

## 2024-07-15 ENCOUNTER — Ambulatory Visit: Admitting: Psychiatry

## 2024-07-22 ENCOUNTER — Ambulatory Visit: Payer: 59 | Admitting: Dermatology

## 2024-07-22 DIAGNOSIS — D239 Other benign neoplasm of skin, unspecified: Secondary | ICD-10-CM

## 2024-07-22 DIAGNOSIS — Z1283 Encounter for screening for malignant neoplasm of skin: Secondary | ICD-10-CM | POA: Diagnosis not present

## 2024-07-22 DIAGNOSIS — Z85828 Personal history of other malignant neoplasm of skin: Secondary | ICD-10-CM

## 2024-07-22 DIAGNOSIS — W908XXA Exposure to other nonionizing radiation, initial encounter: Secondary | ICD-10-CM

## 2024-07-22 DIAGNOSIS — L578 Other skin changes due to chronic exposure to nonionizing radiation: Secondary | ICD-10-CM | POA: Diagnosis not present

## 2024-07-22 DIAGNOSIS — L814 Other melanin hyperpigmentation: Secondary | ICD-10-CM

## 2024-07-22 DIAGNOSIS — R238 Other skin changes: Secondary | ICD-10-CM

## 2024-07-22 DIAGNOSIS — D1801 Hemangioma of skin and subcutaneous tissue: Secondary | ICD-10-CM

## 2024-07-22 DIAGNOSIS — D225 Melanocytic nevi of trunk: Secondary | ICD-10-CM

## 2024-07-22 DIAGNOSIS — D229 Melanocytic nevi, unspecified: Secondary | ICD-10-CM

## 2024-07-22 DIAGNOSIS — L219 Seborrheic dermatitis, unspecified: Secondary | ICD-10-CM

## 2024-07-22 DIAGNOSIS — L821 Other seborrheic keratosis: Secondary | ICD-10-CM

## 2024-07-22 NOTE — Progress Notes (Signed)
 Follow-Up Visit   Subjective  Sonia Robinson is a 73 y.o. female who presents for the following: Skin Cancer Screening and Full Body Skin Exam  The patient presents for Total-Body Skin Exam (TBSE) for skin cancer screening and mole check. The patient has spots, moles and lesions to be evaluated, some may be new or changing, including brown spots on the left forehead.  She has a history of BCC of the left lateral cheek, exc 07/03/22.     The following portions of the chart were reviewed this encounter and updated as appropriate: medications, allergies, medical history  Review of Systems:  No other skin or systemic complaints except as noted in HPI or Assessment and Plan.  Objective  Well appearing patient in no apparent distress; mood and affect are within normal limits.  A full examination was performed including scalp, head, eyes, ears, nose, lips, neck, chest, axillae, abdomen, back, buttocks, bilateral upper extremities, bilateral lower extremities, hands, feet, fingers, toes, fingernails, and toenails. All findings within normal limits unless otherwise noted below.   Relevant physical exam findings are noted in the Assessment and Plan.    Assessment & Plan   SKIN CANCER SCREENING PERFORMED TODAY.  ACTINIC DAMAGE - Chronic condition, secondary to cumulative UV/sun exposure - diffuse scaly erythematous macules with underlying dyspigmentation - Recommend daily broad spectrum sunscreen SPF 30+ to sun-exposed areas, reapply every 2 hours as needed.  - Staying in the shade or wearing long sleeves, sun glasses (UVA+UVB protection) and wide brim hats (4-inch brim around the entire circumference of the hat) are also recommended for sun protection.  - Call for new or changing lesions.  LENTIGINES, SEBORRHEIC KERATOSES, HEMANGIOMAS - Benign normal skin lesions - Sks Left forehead- waxy tan macules, Reassured benign age-related growth.  Discussed cryotherapy if spot(s) become irritated  or inflamed.  Call clinic for new or changing lesions.    - Benign-appearing. Observation. - Call for any changes  MELANOCYTIC NEVI - Tan-brown and/or pink-flesh-colored symmetric macules and papules - 8 x 5 mm thin tan papule at the left breast  - Benign appearing on exam today - Observation - Call clinic for new or changing moles - Recommend daily use of broad spectrum spf 30+ sunscreen to sun-exposed areas.   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 Excised 07/03/2022. - No evidence of recurrence today - Recommend regular full body skin exams - Recommend daily broad spectrum sunscreen SPF 30+ to sun-exposed areas, reapply every 2 hours as needed.  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   Treatment Plan: Benign-appearing. Stable compared to previous visit. Observation.  Call clinic for new or changing moles.  Recommend daily use of broad spectrum spf 30+ sunscreen to sun-exposed areas.    SEBORRHEIC DERMATITIS Exam: mild scale in the scalp  Chronic and persistent condition with duration or expected duration over one year. Condition is symptomatic/ bothersome to patient. Not currently at goal.   Seborrheic Dermatitis is a chronic persistent rash characterized by pinkness and scaling most commonly of the mid face but also can occur on the scalp (dandruff), ears; mid chest, mid back and groin.  It tends to be exacerbated by stress and cooler weather.  People who have neurologic disease may experience new  onset or exacerbation of existing seborrheic dermatitis.  The condition is not curable but treatable and can be controlled.  Treatment Plan: Recommend OTC Head & Shoulders shampoo 2-3x per week, massage into scalp and let sit 3-5 minutes  before rinsing.   Return in about 1 year (around 07/22/2025) for TBSE, Hx BCC.  IAndrea Kerns, CMA, am acting as scribe for Rexene Rattler, MD .   Documentation: I have reviewed the above documentation for accuracy and completeness, and I agree with the above.  Rexene Rattler, MD

## 2024-07-22 NOTE — Patient Instructions (Addendum)
 Recommend OTC Head & Shoulders shampoo 2-3x per week, massage into scalp and let sit 3-5 minutes before rinsing.   Seborrheic Keratosis  What causes seborrheic keratoses? Seborrheic keratoses are harmless, common skin growths that first appear during adult life.  As time goes by, more growths appear.  Some people may develop a large number of them.  Seborrheic keratoses appear on both covered and uncovered body parts.  They are not caused by sunlight.  The tendency to develop seborrheic keratoses can be inherited.  They vary in color from skin-colored to gray, brown, or even black.  They can be either smooth or have a rough, warty surface.   Seborrheic keratoses are superficial and look as if they were stuck on the skin.  Under the microscope this type of keratosis looks like layers upon layers of skin.  That is why at times the top layer may seem to fall off, but the rest of the growth remains and re-grows.    Treatment Seborrheic keratoses do not need to be treated, but can easily be removed in the office.  Seborrheic keratoses often cause symptoms when they rub on clothing or jewelry.  Lesions can be in the way of shaving.  If they become inflamed, they can cause itching, soreness, or burning.  Removal of a seborrheic keratosis can be accomplished by freezing, burning, or surgery. If any spot bleeds, scabs, or grows rapidly, please return to have it checked, as these can be an indication of a skin cancer.   Due to recent changes in healthcare laws, you may see results of your pathology and/or laboratory studies on MyChart before the doctors have had a chance to review them. We understand that in some cases there may be results that are confusing or concerning to you. Please understand that not all results are received at the same time and often the doctors may need to interpret multiple results in order to provide you with the best plan of care or course of treatment. Therefore, we ask that you  please give us  2 business days to thoroughly review all your results before contacting the office for clarification. Should we see a critical lab result, you will be contacted sooner.   If You Need Anything After Your Visit  If you have any questions or concerns for your doctor, please call our main line at [phone] and press option 4 to reach your doctor's medical assistant. If no one answers, please leave a voicemail as directed and we will return your call as soon as possible. Messages left after 4 pm will be answered the following business day.   You may also send us  a message via MyChart. We typically respond to MyChart messages within 1-2 business days.  For prescription refills, please ask your pharmacy to contact our office. Our fax number is [phone].  If you have an urgent issue when the clinic is closed that cannot wait until the next business day, you can page your doctor at the number below.    Please note that while we do our best to be available for urgent issues outside of office hours, we are not available 24/7.   If you have an urgent issue and are unable to reach us , you may choose to seek medical care at your doctor's office, retail clinic, urgent care center, or emergency room.  If you have a medical emergency, please immediately call 911 or go to the emergency department.  Pager Numbers  - Dr. Hester: [phone]  -  Dr. Jackquline: [phone]  - Dr. Claudene: [phone]   - Dr. Raymund: [phone]  In the event of inclement weather, please call our main line at [phone] for an update on the status of any delays or closures.  Dermatology Medication Tips: Please keep the boxes that topical medications come in in order to help keep track of the instructions about where and how to use these. Pharmacies typically print the medication instructions only on the boxes and not directly on the medication tubes.   If your medication is too expensive, please  contact our office at [phone] option 4 or send us  a message through MyChart.   We are unable to tell what your co-pay for medications will be in advance as this is different depending on your insurance coverage. However, we may be able to find a substitute medication at lower cost or fill out paperwork to get insurance to cover a needed medication.   If a prior authorization is required to get your medication covered by your insurance company, please allow us  1-2 business days to complete this process.  Drug prices often vary depending on where the prescription is filled and some pharmacies may offer cheaper prices.  The website www.goodrx.com contains coupons for medications through different pharmacies. The prices here do not account for what the cost may be with help from insurance (it may be cheaper with your insurance), but the website can give you the price if you did not use any insurance.  - You can print the associated coupon and take it with your prescription to the pharmacy.  - You may also stop by our office during regular business hours and pick up a GoodRx coupon card.  - If you need your prescription sent electronically to a different pharmacy, notify our office through [facility] or by phone at [phone] option 4.     Si Usted Necesita Algo Despus de Su Visita  Tambin puede enviarnos un mensaje a travs de Clinical cytogeneticist. Por lo general respondemos a los mensajes de MyChart en el transcurso de 1 a 2 das hbiles.  Para renovar recetas, por favor pida a su farmacia que se ponga en contacto con nuestra oficina. Randi lakes de fax es [place] [phone].  Si tiene un asunto urgente cuando la clnica est cerrada y que no puede esperar hasta el siguiente da hbil, puede llamar/localizar a su doctor(a) al nmero que aparece a continuacin.   Por favor, tenga en cuenta que aunque hacemos todo lo posible para estar disponibles para asuntos urgentes fuera del horario de  [place], no estamos disponibles las 24 horas del da, los 7 [address]  [address] de la [place].   Si tiene un problema urgente y no puede comunicarse con nosotros, puede optar por buscar atencin mdica  en el consultorio de su doctor(a), en una clnica privada, en un centro de atencin urgente o en una sala de emergencias.  Si tiene Engineer, drilling, por favor llame inmediatamente al 911 o vaya a la sala de emergencias.  Nmeros de bper  - Dr. Hester: [phone]  - Dra. Jackquline: [phone]  - Dr. Claudene: [phone]  - Dra. Kitts: [phone]  En caso de inclemencias del [place], por favor llame a nuestra lnea principal al [phone] para una actualizacin sobre el estado de cualquier retraso o cierre.  Consejos para la medicacin en dermatologa: Por favor, guarde las cajas en las que vienen los medicamentos de uso tpico para ayudarle a seguir las instrucciones sobre dnde y cmo usarlos. [employer]  generalmente imprimen las instrucciones del medicamento slo en las cajas y no directamente en los tubos del [place].   Si su medicamento es muy caro, por favor, pngase en contacto con landry rieger llamando al [phone] y presione la opcin 4 o envenos un mensaje a travs de Clinical cytogeneticist.   No podemos decirle cul ser su copago por los medicamentos por adelantado ya que esto es diferente dependiendo de la cobertura de su seguro. Sin embargo, es posible que podamos encontrar un medicamento sustituto a Audiological scientist un formulario para que el seguro cubra el medicamento que se considera necesario.   Si se requiere una autorizacin previa para que su compaa de seguros malta su medicamento, por favor permtanos de 1 a 2 das hbiles para completar este proceso.  Los precios de los medicamentos varan con frecuencia dependiendo del Environmental consultant de dnde se surte la receta y alguna farmacias pueden ofrecer precios ms baratos.  El sitio web www.goodrx.com tiene cupones para  medicamentos de Health and safety inspector. Los precios aqu no tienen en cuenta lo que podra costar con la ayuda del seguro (puede ser ms barato con su seguro), pero el sitio web puede darle el precio si no utiliz Tourist information centre manager.  - Puede imprimir el cupn correspondiente y llevarlo con su receta a la farmacia.  - Tambin puede pasar por nuestra oficina durante el horario de atencin regular y Education officer, museum una tarjeta de cupones de GoodRx.  - Si necesita que su receta se enve electrnicamente a una farmacia diferente, informe a nuestra oficina a travs de MyChart de [place] o por telfono llamando al [phone] y presione la opcin 4.

## 2024-08-13 ENCOUNTER — Ambulatory Visit (INDEPENDENT_AMBULATORY_CARE_PROVIDER_SITE_OTHER): Admitting: Psychiatry

## 2024-08-13 ENCOUNTER — Encounter: Payer: Self-pay | Admitting: Psychiatry

## 2024-08-13 ENCOUNTER — Other Ambulatory Visit: Payer: Self-pay

## 2024-08-13 ENCOUNTER — Other Ambulatory Visit
Admission: RE | Admit: 2024-08-13 | Discharge: 2024-08-13 | Disposition: A | Discharge status: Home / Self Care | Source: Ambulatory Visit | Attending: Psychiatry | Admitting: Psychiatry

## 2024-08-13 ENCOUNTER — Ambulatory Visit: Payer: Self-pay | Admitting: Psychiatry

## 2024-08-13 VITALS — BP 133/80 | HR 68 | Temp 97.3°F | Ht 64.0 in | Wt 244.6 lb

## 2024-08-13 DIAGNOSIS — F3341 Major depressive disorder, recurrent, in partial remission: Secondary | ICD-10-CM | POA: Insufficient documentation

## 2024-08-13 DIAGNOSIS — F4310 Post-traumatic stress disorder, unspecified: Secondary | ICD-10-CM | POA: Insufficient documentation

## 2024-08-13 DIAGNOSIS — Z79899 Other long term (current) drug therapy: Secondary | ICD-10-CM

## 2024-08-13 DIAGNOSIS — F411 Generalized anxiety disorder: Secondary | ICD-10-CM | POA: Diagnosis not present

## 2024-08-13 DIAGNOSIS — F09 Unspecified mental disorder due to known physiological condition: Secondary | ICD-10-CM | POA: Insufficient documentation

## 2024-08-13 LAB — TSH: TSH: 1.553 u[IU]/mL (ref 0.350–4.500)

## 2024-08-13 MED ORDER — CLONAZEPAM 1 MG PO TABS: 0.5000 mg | ORAL_TABLET | Freq: Two times a day (BID) | ORAL | 0 refills | Status: DC

## 2024-08-13 NOTE — Progress Notes (Signed)
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 the patient ( e.g., review of test, records ), obtaining and to review and separately obtained history , ordering medications and test ,psychoeducation and supportive psychotherapy and care coordination,as well as documenting clinical information in electronic health record. This note was generated in part or whole  with voice recognition software. Voice recognition is usually quite accurate but there are transcription errors that can and very often do occur. I apologize for any typographical errors that were not detected and corrected.    Jessah Danser, MD 10/24/202510:48 AM

## 2024-08-13 NOTE — Patient Instructions (Signed)
 Please get the following labs - urine drug screen and TSH , please go to [facility]   Please check with your cardiologist regarding this medication:  Prazosin Capsules What is this medication? PRAZOSIN (PRA zoe sin) treats high blood pressure. It works by relaxing blood vessels, which decreases the amount of work the heart has to do. It belongs to a group of medications called alpha blockers. This medicine may be used for other purposes; ask your health care provider or pharmacist if you have questions. COMMON BRAND NAME(S): Minipress What should I tell my care team before I take this medication? They need to know if you have any of the following conditions: Kidney disease An unusual or allergic reaction to prazosin, other medications, foods, dyes, or preservatives Pregnant or trying to get pregnant Breast-feeding How should I use this medication? Take this medication by mouth. Take it as directed on the prescription label at the same time every day. Keep taking it unless your care team tells you to stop. Talk to your care team about the use of this medication in children. Special care may be needed. Overdosage: If you think you have taken too much of this medicine contact a poison control center or emergency room at once. NOTE: This medicine is only for you. Do not share this medicine with others. What if I miss a dose? If you miss a dose, take it as soon as you can. If it is almost time for your next dose, take only that dose. Do not take double or extra doses. What may interact with this medication? This medication may interact with the following: Diuretics Medications for high blood pressure Sildenafil Tadalafil Vardenafil This list may not describe all possible interactions. Give your health care provider a list of all the medicines, herbs, non-prescription drugs, or dietary supplements you use. Also tell them if you smoke, drink alcohol, or use illegal drugs.  Some items may interact with your medicine. What should I watch for while using this medication? Visit your care team for regular checks on your progress. Check your blood pressure regularly. Ask your care team what your blood pressure should be and when you should contact them. Drowsiness and dizziness are more likely to occur after the first dose, after an increase in dose, or during hot weather or exercise. These effects can decrease once your body adjusts to this medication. Do not drive, use machinery, or do anything that needs mental alertness until you know how this medication affects you. Do not stand or sit up quickly, especially if you are an older patient. This reduces the risk of dizzy or fainting spells. Alcohol can make you more drowsy and dizzy. Avoid alcoholic drinks. Do not treat yourself for coughs, colds or allergies without asking your care team for advice. Some ingredients can increase your blood pressure. Your mouth may get dry. Chewing sugarless gum or sucking hard candy, and drinking plenty of water may help. Contact your care team if the problem does not go away or is severe. For males, contact your care team right away if you have an erection that lasts longer than 4 hours or if it becomes painful. This may be a sign of a serious problem and must be treated right away to prevent permanent damage. What side effects may I notice from receiving this medication? Side effects that you should report to your care team as soon as possible: Allergic reactions--skin rash, itching, hives, swelling of the face, lips,  tongue, or throat Low blood pressure--dizziness, feeling faint or lightheaded, blurry vision Prolonged or painful erection Side effects that usually do not require medical attention (report to your care team if they continue or are bothersome): Blurry vision Drowsiness Fatigue Headache Heart palpitations--rapid, pounding, or irregular heartbeat Nausea Swelling of the  ankles, hands, or feet This list may not describe all possible side effects. Call your doctor for medical advice about side effects. You may report side effects to FDA at 1-800-FDA-1088. Where should I keep my medication? Keep out of the reach of children and pets. Store at room temperature between 15 and 30 degrees C (59 and 86 degrees F). Throw away any unused medication after the expiration date. NOTE: This sheet is a summary. It may not cover all possible information. If you have questions about this medicine, talk to your doctor, pharmacist, or health care provider.  2024 Elsevier/Gold Standard (2021-09-19 00:00:00)

## 2024-08-15 ENCOUNTER — Encounter: Payer: Self-pay | Admitting: Psychiatry

## 2024-08-20 LAB — URINE DRUGS OF ABUSE SCREEN W ALC, ROUTINE (REF LAB)
Amphetamines, Urine: NEGATIVE ng/mL
Barbiturate, Ur: NEGATIVE ng/mL
Cannabinoid Quant, Ur: NEGATIVE ng/mL
Cocaine (Metab.): NEGATIVE ng/mL
Creatinine, Urine: 65.3 mg/dL (ref 20.0–300.0)
Ethanol U, Quan: NEGATIVE %
Methadone Screen, Urine: NEGATIVE ng/mL
Nitrite Urine, Quantitative: NEGATIVE ug/mL
OPIATE SCREEN URINE: NEGATIVE ng/mL
Phencyclidine, Ur: NEGATIVE ng/mL
Propoxyphene, Urine: NEGATIVE ng/mL
pH, Urine: 5.1 (ref 4.5–8.9)

## 2024-08-20 LAB — DRUG PROFILE 799031
BENZODIAZEPINES: POSITIVE — AB
HYDROXYALPRAZOLAM: NEGATIVE
NORDIAZEPAM: NEGATIVE
Oxazepam Gc/Ms Conf, Ur: 489 ng/mL
Oxazepam: POSITIVE — AB

## 2024-08-21 ENCOUNTER — Telehealth: Payer: Self-pay

## 2024-08-21 DIAGNOSIS — Z79899 Other long term (current) drug therapy: Secondary | ICD-10-CM

## 2024-08-21 DIAGNOSIS — F4310 Post-traumatic stress disorder, unspecified: Secondary | ICD-10-CM

## 2024-08-21 MED ORDER — TEMAZEPAM 15 MG PO CAPS: 15.0000 mg | ORAL_CAPSULE | Freq: Every evening | ORAL | 0 refills | Status: DC | PRN

## 2024-08-21 MED ORDER — TEMAZEPAM 22.5 MG PO CAPS
22.5000 mg | ORAL_CAPSULE | Freq: Every evening | ORAL | 0 refills | Status: DC | PRN
Start: 2024-08-21 — End: 2024-09-01

## 2024-08-21 MED ORDER — BUSPIRONE HCL 15 MG PO TABS: 15.0000 mg | ORAL_TABLET | Freq: Two times a day (BID) | ORAL | 2 refills | Status: DC

## 2024-08-21 MED ORDER — TEMAZEPAM 7.5 MG PO CAPS: 7.5000 mg | ORAL_CAPSULE | Freq: Every evening | ORAL | 0 refills | Status: DC | PRN

## 2024-08-21 NOTE — Telephone Encounter (Signed)
 Spoke to patients daughter explained the tapering dosage for the temazepam  she wrote it all down and voiced understanding.

## 2024-08-21 NOTE — Addendum Note (Signed)
 Addended byBETHA COBY HEIGHT on: 08/21/2024 01:37 PM   Modules accepted: Orders

## 2024-08-21 NOTE — Telephone Encounter (Signed)
 Darice patients daughter called stating that he temazepam  was discontinue patient spoke with the pharmacist and was told that due to her high risk for seizures this medication needs to be tapered the patient is really nervous about discontinuing the medication please advise Her daughter also mentioned that you wanted to know how much medication she had left  Ramelteon  she has 13 until she fills her medication for the week Prozac  she has 19 until she fills the medication for the week  Buspar  she will have none left after she fills the medication for the week

## 2024-08-21 NOTE — Telephone Encounter (Signed)
 I have sent a refill for BuSpar .  Please let patient know to request medication refills through pharmacy or by sending a MyChart message.  From my understanding from her previous message it looks like BuSpar  is the only medication that she needs refilled at this time.  Please let me know if there is anything else.

## 2024-08-21 NOTE — Telephone Encounter (Signed)
 Since patient is interested in tapering off temazepam  will reduce temazepam  to 22.5 mg for 2 weeks and then 15 mg for another 2 weeks and then 7.5 mg for another 2 weeks and then could stop taking it.

## 2024-09-01 ENCOUNTER — Telehealth: Payer: Self-pay

## 2024-09-01 DIAGNOSIS — Z79899 Other long term (current) drug therapy: Secondary | ICD-10-CM

## 2024-09-01 MED ORDER — TEMAZEPAM 7.5 MG PO CAPS: 7.5000 mg | ORAL_CAPSULE | Freq: Every evening | ORAL | 0 refills | Status: DC | PRN

## 2024-09-01 MED ORDER — TEMAZEPAM 15 MG PO CAPS: 15.0000 mg | ORAL_CAPSULE | Freq: Every evening | ORAL | 0 refills | Status: DC | PRN

## 2024-09-01 MED ORDER — TEMAZEPAM 22.5 MG PO CAPS: 22.5000 mg | ORAL_CAPSULE | Freq: Every evening | ORAL | 0 refills | Status: DC | PRN

## 2024-09-01 NOTE — Telephone Encounter (Signed)
 Called [facility] spoke to Amy she will get the Temazepam  cancelled

## 2024-09-01 NOTE — Addendum Note (Signed)
 Addended byBETHA COBY HEIGHT on: 09/01/2024 05:03 PM   Modules accepted: Orders

## 2024-09-01 NOTE — Telephone Encounter (Signed)
 Sonia Robinson patients daughter called stating that when her mothers medications were refilled she received a refill of the Temazepam  30 mg so she has continued to give the 30 mg once at night she has not been able to get the tapering dosage at [facility] she is requesting that you send the [facility]ing [facility]. [facility]. Pharmacy has been updated in the system please advise.  [id number] [place], [place] - [address] Phone: [phone]  Fax: [phone]

## 2024-09-01 NOTE — Telephone Encounter (Signed)
 I have sent temazepam  tapering dose to [facility] as requested.

## 2024-09-01 NOTE — Telephone Encounter (Signed)
 Please call and cancel temazepam  7.5 mg and temazepam  15 mg sent to [facility] so that a new prescription can be sent to [facility] as requested.

## 2024-09-02 ENCOUNTER — Ambulatory Visit (INDEPENDENT_AMBULATORY_CARE_PROVIDER_SITE_OTHER): Admitting: Psychiatry

## 2024-09-02 ENCOUNTER — Encounter: Payer: Self-pay | Admitting: Psychiatry

## 2024-09-02 ENCOUNTER — Other Ambulatory Visit: Payer: Self-pay

## 2024-09-02 VITALS — BP 137/84 | HR 71 | Temp 97.1°F | Ht 64.0 in | Wt 240.8 lb

## 2024-09-02 DIAGNOSIS — Z79899 Other long term (current) drug therapy: Secondary | ICD-10-CM | POA: Diagnosis not present

## 2024-09-02 DIAGNOSIS — F3341 Major depressive disorder, recurrent, in partial remission: Secondary | ICD-10-CM

## 2024-09-02 DIAGNOSIS — F09 Unspecified mental disorder due to known physiological condition: Secondary | ICD-10-CM

## 2024-09-02 DIAGNOSIS — F4310 Post-traumatic stress disorder, unspecified: Secondary | ICD-10-CM

## 2024-09-02 DIAGNOSIS — F411 Generalized anxiety disorder: Secondary | ICD-10-CM | POA: Diagnosis not present

## 2024-09-02 MED ORDER — CLONAZEPAM 1 MG PO TABS: 0.5000 mg | ORAL_TABLET | Freq: Two times a day (BID) | ORAL | 2 refills | Status: AC

## 2024-09-02 NOTE — Progress Notes (Signed)
 [facility] MD OP Progress Note  09/02/2024 4:03 PM Sonia Robinson  MRN:  [id number]  Chief Complaint:  Chief Complaint  Patient presents with   Follow-up   Anxiety   Depression   Medication Refill   Discussed the use of AI scribe software for clinical note transcription with the patient, who gave verbal consent to proceed.  History of Present Illness Sonia Robinson is a 73 year old Caucasian female, divorced, lives in [place], in a senior living community, has a history of PTSD, MDD, anxiety, essential tremor, hyponatremia, irritable bowel disease, dry eyes, asthma, COPD, cognitive disorder mild, long-term use of benzodiazepines, obstructive sleep apnea not on CPAP was evaluated in office today for a follow-up appointment.  Patient accompanied by her daughter Ms. Sonia Robinson.  Ongoing memory problems, including difficulty remembering how to navigate familiar areas in her hometown and frequently getting lost while driving, continue to affect her. She relies on a friend and GPS for assistance but continues to feel frustrated by her inability to recall directions. She reports that these memory issues have persisted and impact her daily functioning.  Significant sleep difficulties, specifically trouble falling asleep, persist. She has taken temazepam  for sleep for approximately 20 years and expresses concern about tapering off due to a prior negative experience when she discontinued Ativan  abruptly, which resulted in hospitalization. She currently takes Rozerem  8 mg nightly and wishes to continue this medication because she finds it helpful and appreciates that it is not habit-forming. She also takes melatonin for sleep. She has not yet picked up her temazepam  prescription due to pharmacy delays but plans to follow up. She is in the process of tapering temazepam .  She denies any suicidality, homicidality or perceptual disturbances.  She appeared to be alert, oriented to person place time situation.  3  word memory immediate 3 out of 3 after 5 minutes 3 out of 3.  Attention focus seem to be limited.  Unable to do calculation.  She does have upcoming appointment scheduled with neurology at [facility] who manages her memory problems as well as traumas and look forward to that meeting.  According to daughter no recent changes and is looking forward to tapering off the temazepam  once supplies of the tapering dose as prescribed is received from pharmacy.    Visit Diagnosis:    ICD-10-CM   1. PTSD (post-traumatic stress disorder)  F43.10 clonazePAM  (KLONOPIN ) 1 MG tablet    2. Recurrent major depressive disorder, in partial remission  F33.41 clonazePAM  (KLONOPIN ) 1 MG tablet    3. GAD (generalized anxiety disorder)  F41.1 clonazePAM  (KLONOPIN ) 1 MG tablet    4. Long-term current use of benzodiazepine  Z79.899     5. Mild cognitive disorder  F09       Past Psychiatric History: I have reviewed past psychiatric history from progress note on 08/13/2024.  Past trials of medications like hydroxyzine, Ativan , Paxil, Cymbalta, topiramate.  Had multiple neuropsychological testing completed at [facility] Dr. Donnice Lesches, PhD-2005, 2017, 2022, 2025 diagnosed with mild cognitive disorder. . Past Medical History:  Past Medical History:  Diagnosis Date   Asthma    Basal cell carcinoma 05/31/2022   Left lateral cheek, exc 07/03/2022   Cataract    Chronic pain syndrome    Degenerative joint disease of ankle and foot    Disorder of sacrum    Fallen bladder    Hemorrhoids    High cholesterol    Involuntary muscle contractions    Irritable bowel syndrome  Loss of bladder control    Low back pain    Movement disorder    Nasal inflammation due to allergen    Obesity    Osteoarthritis    Pain in thoracic spine    Prolapse of vaginal vault after hysterectomy    Raynaud's disease with gangrene (HCC)    Restless leg syndrome    Sleep apnea    Sleep disorder    Spasmodic colon    Tremor    Vaginal atrophy      Past Surgical History:  Procedure Laterality Date   CARDIAC CATHETERIZATION N/A 08/06/2015   Procedure: Left Heart Cath and Coronary Angiography;  Surgeon: Sonia JONETTA Lovelace, MD;  Location: [facility] INVASIVE CV LAB;  Service: Cardiovascular;  Laterality: N/A;    Family Psychiatric History: I have reviewed family psychiatric history from progress note on 08/13/2024  Family History:  Family History  Problem Relation Age of Onset   Alcohol abuse Mother    Drug abuse Mother    Depression Mother    Drug abuse Sister    Alcohol abuse Sister    Depression Brother    Intellectual disability Brother     Social History: I have reviewed social history from progress note on 08/13/2024 Social History   Socioeconomic History   Marital status: Divorced    Spouse name: Not on file   Number of children: 2   Years of education: Not on file   Highest education level: Some college, no degree  Occupational History   Not on file  Tobacco Use   Smoking status: Some Days    Current packs/day: 0.25    Types: Cigarettes   Smokeless tobacco: Never  Vaping Use   Vaping status: Never Used  Substance and Sexual Activity   Alcohol use: Yes    Alcohol/week: 4.0 standard drinks of alcohol    Types: 4 Glasses of wine per week    Comment: occ   Drug use: No   Sexual activity: Not Currently  Other Topics Concern   Not on file  Social History Narrative   Not on file   Social Drivers of Health   Financial Resource Strain: Medium Risk (03/14/2023)   Received from [facility]   Overall Financial Resource Strain (CARDIA)    Difficulty of Paying Living Expenses: Somewhat hard  Food Insecurity: No Food Insecurity (06/27/2024)   Received from [facility]   Hunger Vital Sign    Within the past 12 months, you worried that your food would run out before you got the money to buy more.: Never true    Within the past 12 months, the food you bought just didn't last and you didn't have money to get  more.: Never true  Transportation Needs: No Transportation Needs (06/27/2024)   Received from [facility] - Transportation    Lack of Transportation (Medical): No    Lack of Transportation (Non-Medical): No  Physical Activity: Inactive (12/28/2020)   Received from [facility]   Exercise Vital Sign    On average, how many days per week do you engage in moderate to strenuous exercise (like a brisk walk)?: 0 days    On average, how many minutes do you engage in exercise at this level?: 0 min  Stress: Stress Concern Present (12/19/2021)   Received from [facility] - Occupational Stress Questionnaire    Feeling of Stress : Rather much  Social Connections:  Socially Isolated (12/28/2020)   Received from [facility]   Social Connection and Isolation Panel    In a typical week, how many times do you talk on the phone with family, friends, or neighbors?: Once a week    How often do you get together with friends or relatives?: Never    How often do you attend church or religious services?: Never    Do you belong to any clubs or organizations such as church groups, unions, fraternal or athletic groups, or school groups?: No    How often do you attend meetings of the clubs or organizations you belong to?: Never    Are you married, widowed, divorced, separated, never married, or living with a partner?: Divorced    Allergies:  Allergies  Allergen Reactions   Bee Venom Anaphylaxis   Betadine [Povidone Iodine] Shortness Of Breath, Swelling and Rash   Doxycycline Dermatitis and Swelling    Reports lip swelling and rash   Flavoring Agent Hives and Itching   Hydrocodone  Itching and Shortness Of Breath   Iodides Shortness Of Breath, Swelling and Rash    Tongue swelling    Iodine Dermatitis, Shortness Of Breath and Swelling    Tongue swelling   Morphine And Codeine Anaphylaxis and Swelling   Oxycodone Anaphylaxis, Hives, Itching and  Swelling    Lips became swollen   Povidone-Iodine Dermatitis, Shortness Of Breath and Swelling    Tongue swelling   Umeclidinium Swelling and Palpitations    Incruse Ellipta- Had tongue swelling and burning sensation in throat/tongue   Wellbutrin [Bupropion] Swelling    Became swollen all over (including tongue)   Codeine Swelling   Depakote [Divalproex Sodium] Hives and Nausea And Vomiting   Ibuprofen Other (See Comments)    irritates IBS ended up with an ulcer      Naproxen Swelling   Nsaids Other (See Comments)    Marked GI upset   Requip  [Ropinirole ] Other (See Comments)    Caused body jolts, doctor stopped   Tape Swelling and Other (See Comments)    Swelling and redness at site applied    Topamax [Topiramate] Other (See Comments)    Lip swelling and tongue swelling    Amlodipine Dermatitis   Augmentin [Amoxicillin-Pot Clavulanate] Hives, Swelling and Rash   Clindamycin Dermatitis   Latex Hives, Itching, Swelling and Rash   Melatonin Itching and Rash    [facility] BRAND    Penicillins Hives, Itching, Swelling and Rash   Shellfish Allergy Diarrhea, Nausea And Vomiting, Swelling and Rash   Sulfa Antibiotics Hives, Itching and Swelling    Metabolic Disorder Labs: No results found for: HGBA1C, MPG No results found for: PROLACTIN No results found for: CHOL, TRIG, HDL, CHOLHDL, VLDL, LDLCALC Lab Results  Component Value Date   TSH 1.553 08/13/2024    Therapeutic Level Labs: No results found for: LITHIUM No results found for: VALPROATE No results found for: CBMZ  Current Medications: Current Outpatient Medications  Medication Sig Dispense Refill   fluticasone  (FLONASE ) 50 MCG/ACT nasal spray PLACE 2 SPRAYS INTO EACH NOSTRL TWICE A DAY.     acetaZOLAMIDE (DIAMOX) 250 MG tablet Take 250 mg by mouth. (Patient not taking: Reported on 08/13/2024)     albuterol  (PROVENTIL  HFA;VENTOLIN  HFA) 108 (90 BASE) MCG/ACT inhaler Inhale 2 puffs into the lungs  every 6 (six) hours as needed for wheezing or shortness of breath.     ascorbic acid (VITAMIN C) 500 MG tablet Take 1 tablet by mouth daily.  azelastine  (ASTELIN ) 0.1 % nasal spray 2 sprays into each nostril Two (2) times a day. Use in each nostril as directed     bisacodyl (DULCOLAX) 10 MG suppository Place rectally.     busPIRone  (BUSPAR ) 15 MG tablet Take 1 tablet (15 mg total) by mouth 2 (two) times daily. 60 tablet 2   Cholecalciferol  (VITAMIN D3) 2000 units TABS Take 2,000 Units by mouth at bedtime.     [START ON 09/28/2024] clonazePAM  (KLONOPIN ) 1 MG tablet Take 0.5 tablets (0.5 mg total) by mouth 2 (two) times daily. 30 tablet 2   cyanocobalamin (VITAMIN B12) 1000 MCG tablet Take 1,000 mcg by mouth daily.     cycloSPORINE  (RESTASIS ) 0.05 % ophthalmic emulsion Place 1 drop into both eyes every 12 (twelve) hours.      diclofenac sodium (VOLTAREN) 1 % GEL Apply 2 g topically 4 (four) times daily as needed (for pain).      dicyclomine  (BENTYL ) 20 MG tablet Take 20 mg by mouth 4 (four) times daily.      diphenoxylate-atropine (LOMOTIL) 2.5-0.025 MG tablet Take 1 tablet by mouth 3 (three) times daily as needed for diarrhea or loose stools.     FLUoxetine  (PROZAC ) 20 MG capsule Take 60 mg by mouth every morning.     folic acid  (FOLVITE ) 800 MCG tablet Take 800 mcg by mouth daily.     hydrocortisone  (PROCTOSOL HC) 2.5 % rectal cream Place 1 application rectally daily as needed for hemorrhoids or itching.     lidocaine  (XYLOCAINE ) 5 % ointment Apply 1 application topically 2 (two) times daily as needed for mild pain.     LINZESS 290 MCG CAPS capsule Take 290 mcg by mouth daily.     lisinopril  (ZESTRIL ) 30 MG tablet Take 30 mg by mouth daily.     mometasone  (ELOCON ) 0.1 % cream Apply twice daily to hands and arms up to 2 weeks as needed for eczema 45 g 2   Multiple Vitamin (MULTI-VITAMIN) tablet Take 1 tablet by mouth daily.     multivitamin-lutein  (OCUVITE-LUTEIN ) CAPS capsule Take 1 capsule by  mouth daily.     Omega-3 Fatty Acids (FISH OIL) 1000 MG CAPS Take 3,000 mg by mouth at bedtime.     omeprazole (PRILOSEC) 40 MG capsule Take 40 mg by mouth 2 (two) times daily.     ramelteon  (ROZEREM ) 8 MG tablet Take 8 mg by mouth at bedtime.     rifaximin (XIFAXAN) 550 MG TABS tablet Take 550 mg by mouth.     SYMBICORT  160-4.5 MCG/ACT inhaler Inhale into the lungs.     [START ON 09/15/2024] temazepam  (RESTORIL ) 15 MG capsule Take 1 capsule (15 mg total) by mouth at bedtime as needed for up to 15 days for sleep. Weaning off, start after stopping 22.5 mg 15 capsule 0   temazepam  (RESTORIL ) 22.5 MG capsule Take 1 capsule (22.5 mg total) by mouth at bedtime as needed for up to 15 days for sleep. Weaning off 15 capsule 0   [START ON 09/29/2024] temazepam  (RESTORIL ) 7.5 MG capsule Take 1 capsule (7.5 mg total) by mouth at bedtime as needed for up to 15 days for sleep. Weaning off, start after stopping 15 mg 15 capsule 0   TYRVAYA 0.03 MG/ACT SOLN Place 1 spray into both nostrils 2 (two) times daily.     urea (CARMOL) 40 % ointment Apply 1 application topically every evening.     No current facility-administered medications for this visit.     Musculoskeletal: Strength &  Muscle Tone: within normal limits Gait & Station: normal Patient leans: N/A  Psychiatric Specialty Exam: Review of Systems  Psychiatric/Behavioral:  Positive for sleep disturbance. The patient is nervous/anxious.     Blood pressure 137/84, pulse 71, temperature (!) 97.1 F (36.2 C), temperature source Temporal, height 5' 4 (1.626 m), weight 240 lb 12.8 oz (109.2 kg).Body mass index is 41.33 kg/m.  General Appearance: Casual  Eye Contact:  Fair  Speech:  Clear and Coherent  Volume:  Normal  Mood:  Anxious  Affect:  Appropriate  Thought Process:  Goal Directed and Descriptions of Associations: Intact  Orientation:  Full (Time, Place, and Person)  Thought Content: Logical   Suicidal Thoughts:  No  Homicidal Thoughts:   No  Memory:  Immediate;   Fair Recent;   Fair Remote;   Poor  Judgement:  Fair  Insight:  Fair  Psychomotor Activity:  Tremor  Concentration:  Concentration: limited and Attention Span: limited  Recall:  [employer] of Knowledge: Fair  Language: Fair  Akathisia:  No  Handed:  Left  AIMS (if indicated): does have essential tremors  Assets:  Manufacturing Systems Engineer Desire for Improvement Housing Social Support Transportation  ADL's:  Intact  Cognition: improving  Sleep:  varies   Screenings: GAD-7    Loss Adjuster, Chartered Office Visit from 09/02/2024 in [facility] Office Visit from 08/13/2024 in [facility]  Total GAD-7 Score 5 9   PHQ2-9    Flowsheet Row Office Visit from 09/02/2024 in [facility] Office Visit from 08/13/2024 in [facility]  PHQ-2 Total Score 1 2  PHQ-9 Total Score 6 10   Flowsheet Row Office Visit from 09/02/2024 in [facility] Office Visit from 08/13/2024 in [facility] ED from 05/10/2024 in [facility]  C-SSRS RISK CATEGORY No Risk No Risk No Risk     Assessment and Plan: CRUZ DEVILLA is a 73 year old Caucasian female, presented for a follow-up appointment, discussed assessment and plan as noted below.  1. PTSD (post-traumatic stress disorder)-unstable History of PTSD symptoms including nightmares, panic attacks.  Plan to start Prazosin once cardiology clearance received. Continue Buspirone  15 mg twice daily. Continue Fluoxetine  60 mg daily Continue Rozerem  8 mg at bedtime for sleep Taper off Temazepam  as discussed, instructions provided. Continue Melatonin 10 mg at bedtime  2. Recurrent major depressive disorder, in partial remission Longstanding depression symptoms  although with improvement. Continue Fluoxetine  60 mg daily  3. GAD (generalized anxiety disorder)-improving Currently denies any significant panic attacks and reports overall improvement. Continue Buspirone  15 mg twice daily Continue Fluoxetine  60 mg daily Encouraged to establish care with therapist.   4. Long-term current use of benzodiazepine clonazepam , temazepam -improving Patient with long-term clonazepam , temazepam  use, with ongoing cognitive issues likely due to use of CNS depressant medications including benzodiazepines. Taper off Temazepam , instructions provided. Continue Clonazepam  0.5 mg twice daily for now. Reviewed [place] PMP AWARxE   5. Mild cognitive disorder-chronic-improving Currently under the care of of neurologist at [facility].  Collateral information obtained from daughter who was present in session.  Follow-up Follow-up in clinic in 2 months or sooner if needed.    Collaboration of Care: Collaboration of Care: Other encouraged to continue follow-up with neurology.  Patient/Guardian was advised Release of Information must be obtained prior to any record release in order to collaborate their care with  an outside provider. Patient/Guardian was advised if they have not already done so to contact the registration department to sign all necessary forms in order for us  to release information regarding their care.   Consent: Patient/Guardian gives verbal consent for treatment and assignment of benefits for services provided during this visit. Patient/Guardian expressed understanding and agreed to proceed.  This note was generated in part or whole with voice recognition software. Voice recognition is usually quite accurate but there are transcription errors that can and very often do occur. I apologize for any typographical errors that were not detected and corrected.     Raihan Kimmel, MD 09/02/2024, 4:03 PM

## 2024-10-29 NOTE — Telephone Encounter (Signed)
 10/29/24-Left pt vm to call back to move appointment to 11:30 am with Dr. Curlene.

## 2024-11-04 ENCOUNTER — Other Ambulatory Visit: Payer: Self-pay

## 2024-11-04 ENCOUNTER — Encounter: Payer: Self-pay | Admitting: Psychiatry

## 2024-11-04 ENCOUNTER — Ambulatory Visit (INDEPENDENT_AMBULATORY_CARE_PROVIDER_SITE_OTHER): Admitting: Psychiatry

## 2024-11-04 VITALS — BP 134/82 | HR 71 | Temp 97.6°F | Ht 65.0 in | Wt 243.0 lb

## 2024-11-04 DIAGNOSIS — F3342 Major depressive disorder, recurrent, in full remission: Secondary | ICD-10-CM

## 2024-11-04 DIAGNOSIS — Z79899 Other long term (current) drug therapy: Secondary | ICD-10-CM | POA: Diagnosis not present

## 2024-11-04 DIAGNOSIS — F09 Unspecified mental disorder due to known physiological condition: Secondary | ICD-10-CM | POA: Diagnosis not present

## 2024-11-04 DIAGNOSIS — F411 Generalized anxiety disorder: Secondary | ICD-10-CM

## 2024-11-04 DIAGNOSIS — F4310 Post-traumatic stress disorder, unspecified: Secondary | ICD-10-CM

## 2024-11-04 MED ORDER — BUSPIRONE HCL 15 MG PO TABS: 15.0000 mg | ORAL_TABLET | Freq: Two times a day (BID) | ORAL | 2 refills | Status: AC

## 2024-11-04 MED ORDER — FLUOXETINE HCL 20 MG PO CAPS: 60.0000 mg | ORAL_CAPSULE | Freq: Every morning | ORAL | 2 refills | Status: AC

## 2024-11-04 NOTE — Progress Notes (Unsigned)
 BH MD OP Progress Note  11/04/2024 5:07 PM Sonia Robinson  MRN:  [id number]  Chief Complaint:  Chief Complaint  Patient presents with   Follow-up   Anxiety   Medication Refill   Depression   Discussed the use of AI scribe software for clinical note transcription with the patient, who gave verbal consent to proceed.  History of Present Illness Sonia Robinson is a 74 year old Caucasian female, divorced, lives in [place] in a senior living community, has a history of PTSD, MDD, GAD, long-term current use of benzodiazepines, mild cognitive disorder, hyperlipidemia, irritable bowel disease, hyponatremia, essential tremor, dry eye, asthma, COPD, cognitive disorder mild, obstructive sleep apnea not on CPAP was evaluated in office today for a follow-up appointment.  She is accompanied by her daughter Ms. Darice Ronde.  Her daughter reports ongoing sleep difficulties, noting frequent nighttime awakenings and limited total sleep duration. She describes falling asleep between 11:30 PM and midnight, consistently waking around 3:00 AM, and often remaining awake for several hours. She does not nap during the day. She recently started Lyrica 25 mg at bedtime for restless legs initiated by neurology. In addition to that she continue to take ramelteon  (Rozerem ), clonazepam  (half of 1 mg in the morning and half at night), and melatonin (two tablets at night, with an additional 5 mg dose if she wakes at 3 AM). She has discontinued temazepam  and reports improved morning alertness and cognitive clarity since stopping it. Her daughter notes less repetition and increased clarity in conversations, and she feels her brain is starting to wake up.  Intermittent panic attacks, characterized by chest tightness and throat discomfort, occur and she manages these episodes by preparing hot green tea and using self-talk to calm herself. She enjoyed the recent holiday season with family and describes positive experiences and  support from her daughter.  She currently denies any significant sadness, anhedonia or hopelessness.  She continues to have intrusive memories about her past history of trauma with occasional nightmares.  She is interested in starting psychotherapy and is scheduled to establish care with our in-house therapist this month.  She denies any suicidality, homicidality or perceptual disturbances.  She reports current tobacco use, stating she is still smoking but has been cutting back. She describes being able to go up to 3 days without smoking and notes that if she is not around others who are smoking, she does not feel the urge to smoke.  She appeared to be alert, oriented to person place time situation.    Collateral information obtained from daughter who was present in session as well.    Visit Diagnosis:    ICD-10-CM   1. PTSD (post-traumatic stress disorder)  F43.10 busPIRone  (BUSPAR ) 15 MG tablet    FLUoxetine  (PROZAC ) 20 MG capsule    2. Recurrent major depressive disorder, in full remission  F33.42 FLUoxetine  (PROZAC ) 20 MG capsule    3. GAD (generalized anxiety disorder)  F41.1 FLUoxetine  (PROZAC ) 20 MG capsule    4. Long-term current use of benzodiazepine  Z79.899     5. Mild cognitive disorder  F09       Past Psychiatric History: I have reviewed past psychiatric history from progress note on 08/13/2024.  Past trials of medications like hydroxyzine, Ativan , Paxil, Cymbalta, topiramate.  Had multiple neuropsychological testing completed at [facility], Dr. Donnice Lesches, PhD-2005, 2017, 2022, 2025-diagnosed with mild cognitive disorder.  Past Medical History:  Past Medical History:  Diagnosis Date   Asthma    Basal cell carcinoma  05/31/2022   Left lateral cheek, exc 07/03/2022   Cataract    Chronic pain syndrome    Degenerative joint disease of ankle and foot    Disorder of sacrum    Fallen bladder    Hemorrhoids    High cholesterol    Involuntary muscle contractions     Irritable bowel syndrome    Loss of bladder control    Low back pain    Movement disorder    Nasal inflammation due to allergen    Obesity    Osteoarthritis    Pain in thoracic spine    Prolapse of vaginal vault after hysterectomy    Raynaud's disease with gangrene (HCC)    Restless leg syndrome    Sleep apnea    Sleep disorder    Spasmodic colon    Tremor    Vaginal atrophy     Past Surgical History:  Procedure Laterality Date   CARDIAC CATHETERIZATION N/A 08/06/2015   Procedure: Left Heart Cath and Coronary Angiography;  Surgeon: Cara JONETTA Lovelace, MD;  Location: [facility] INVASIVE CV LAB;  Service: Cardiovascular;  Laterality: N/A;    Family Psychiatric History: Reviewed family psychiatric history from progress note on 08/13/2024.  Family History:  Family History  Problem Relation Age of Onset   Alcohol abuse Mother    Drug abuse Mother    Depression Mother    Drug abuse Sister    Alcohol abuse Sister    Depression Brother    Intellectual disability Brother     Social History: I have reviewed social history from progress note on 08/13/2024. Social History   Socioeconomic History   Marital status: Divorced    Spouse name: Not on file   Number of children: 2   Years of education: Not on file   Highest education level: Some college, no degree  Occupational History   Not on file  Tobacco Use   Smoking status: Some Days    Current packs/day: 0.25    Types: Cigarettes   Smokeless tobacco: Never  Vaping Use   Vaping status: Never Used  Substance and Sexual Activity   Alcohol use: Yes    Alcohol/week: 4.0 standard drinks of alcohol    Types: 4 Glasses of wine per week    Comment: occ   Drug use: No   Sexual activity: Not Currently  Other Topics Concern   Not on file  Social History Narrative   Not on file   Social Drivers of Health   Tobacco Use: High Risk (11/04/2024)   Patient History    Smoking Tobacco Use: Some Days    Smokeless Tobacco Use: Never     Passive Exposure: Not on file  Financial Resource Strain: Medium Risk (03/14/2023)   Received from [facility]   Overall Financial Resource Strain (CARDIA)    Difficulty of Paying Living Expenses: Somewhat hard  Food Insecurity: No Food Insecurity (06/27/2024)   Received from [facility]   Epic    Within the past 12 months, you worried that your food would run out before you got the money to buy more.: Never true    Within the past 12 months, the food you bought just didn't last and you didn't have money to get more.: Never true  Transportation Needs: No Transportation Needs (06/27/2024)   Received from [facility] - Transportation    Lack of Transportation (Medical): No    Lack of Transportation (Non-Medical): No  Physical Activity: Not  on file  Stress: Stress Concern Present (12/19/2021)   Received from [facility] - Occupational Stress Questionnaire    Feeling of Stress : Rather much  Social Connections: Not on file  Depression (PHQ2-9): Low Risk (11/04/2024)   Depression (PHQ2-9)    PHQ-2 Score: 0  Recent Concern: Depression (PHQ2-9) - Medium Risk (09/02/2024)   Depression (PHQ2-9)    PHQ-2 Score: 6  Alcohol Screen: Not on file  Housing: Low Risk (10/21/2022)   Housing    Last Housing Risk Score: 0  Utilities: Low Risk (06/27/2024)   Received from [facility]   Utilities    Within the past 12 months, have you been unable to get utilities(heat, electricity) when it was really needed?: No  Health Literacy: Not on file    Allergies: Allergies[1]  Metabolic Disorder Labs: No results found for: HGBA1C, MPG No results found for: PROLACTIN No results found for: CHOL, TRIG, HDL, CHOLHDL, VLDL, LDLCALC Lab Results  Component Value Date   TSH 1.553 08/13/2024    Therapeutic Level Labs: No results found for: LITHIUM No results found for: VALPROATE No results found for: CBMZ  Current  Medications: Current Outpatient Medications  Medication Sig Dispense Refill   albuterol  (PROVENTIL  HFA;VENTOLIN  HFA) 108 (90 BASE) MCG/ACT inhaler Inhale 2 puffs into the lungs every 6 (six) hours as needed for wheezing or shortness of breath.     ascorbic acid (VITAMIN C) 500 MG tablet Take 1 tablet by mouth daily.     atorvastatin  (LIPITOR) 10 MG tablet Take 10 mg by mouth daily.     azelastine  (ASTELIN ) 0.1 % nasal spray 2 sprays into each nostril Two (2) times a day. Use in each nostril as directed     bisacodyl (DULCOLAX) 10 MG suppository Place rectally.     Cholecalciferol  (VITAMIN D3) 2000 units TABS Take 2,000 Units by mouth at bedtime.     clonazePAM  (KLONOPIN ) 1 MG tablet Take 0.5 tablets (0.5 mg total) by mouth 2 (two) times daily. 30 tablet 2   cyanocobalamin (VITAMIN B12) 1000 MCG tablet Take 1,000 mcg by mouth daily.     cycloSPORINE  (RESTASIS ) 0.05 % ophthalmic emulsion Place 1 drop into both eyes every 12 (twelve) hours.      diclofenac sodium (VOLTAREN) 1 % GEL Apply 2 g topically 4 (four) times daily as needed (for pain).      dicyclomine  (BENTYL ) 20 MG tablet Take 20 mg by mouth 4 (four) times daily.      diphenoxylate-atropine (LOMOTIL) 2.5-0.025 MG tablet Take 1 tablet by mouth 3 (three) times daily as needed for diarrhea or loose stools.     EPINEPHrine 0.3 mg/0.3 mL IJ SOAJ injection Inject 0.3 mg into the muscle.     fluticasone  (FLONASE ) 50 MCG/ACT nasal spray PLACE 2 SPRAYS INTO EACH NOSTRL TWICE A DAY.     folic acid  (FOLVITE ) 800 MCG tablet Take 800 mcg by mouth daily.     hydrocortisone  (PROCTOSOL HC) 2.5 % rectal cream Place 1 application rectally daily as needed for hemorrhoids or itching.     lidocaine  (XYLOCAINE ) 5 % ointment Apply 1 application topically 2 (two) times daily as needed for mild pain.     LINZESS 290 MCG CAPS capsule Take 290 mcg by mouth daily.     lisinopril  (ZESTRIL ) 30 MG tablet Take 30 mg by mouth daily.     mometasone  (ELOCON ) 0.1 % cream  Apply twice daily to hands and arms up  to 2 weeks as needed for eczema 45 g 2   Multiple Vitamin (MULTI-VITAMIN) tablet Take 1 tablet by mouth daily.     multivitamin-lutein  (OCUVITE-LUTEIN ) CAPS capsule Take 1 capsule by mouth daily.     Omega-3 Fatty Acids (FISH OIL) 1000 MG CAPS Take 3,000 mg by mouth at bedtime.     omeprazole (PRILOSEC) 40 MG capsule Take 40 mg by mouth 2 (two) times daily.     ramelteon  (ROZEREM ) 8 MG tablet Take 8 mg by mouth at bedtime.     rifaximin (XIFAXAN) 550 MG TABS tablet Take 550 mg by mouth.     SYMBICORT  160-4.5 MCG/ACT inhaler Inhale into the lungs.     acetaZOLAMIDE (DIAMOX) 250 MG tablet Take 250 mg by mouth. (Patient not taking: Reported on 08/13/2024)     Biotin 1 MG CAPS Take by mouth.     busPIRone  (BUSPAR ) 15 MG tablet Take 1 tablet (15 mg total) by mouth 2 (two) times daily. 60 tablet 2   FLUoxetine  (PROZAC ) 20 MG capsule Take 3 capsules (60 mg total) by mouth every morning. 90 capsule 2   TYRVAYA 0.03 MG/ACT SOLN Place 1 spray into both nostrils 2 (two) times daily.     urea (CARMOL) 40 % ointment Apply 1 application topically every evening.     No current facility-administered medications for this visit.     Musculoskeletal: Strength & Muscle Tone: within normal limits Gait & Station: normal Patient leans: N/A  Psychiatric Specialty Exam: Review of Systems  Psychiatric/Behavioral:  Positive for sleep disturbance. The patient is nervous/anxious.     Blood pressure 134/82, pulse 71, temperature 97.6 F (36.4 C), temperature source Temporal, height 5' 5 (1.651 m), weight 243 lb (110.2 kg).Body mass index is 40.44 kg/m.  General Appearance: Casual  Eye Contact:  Fair  Speech:  Clear and Coherent  Volume:  Normal  Mood:  Anxious coping better  Affect:  Congruent  Thought Process:  Goal Directed and Descriptions of Associations: Intact  Orientation:  Full (Time, Place, and Person)  Thought Content: Logical   Suicidal Thoughts:  No   Homicidal Thoughts:  No  Memory:  Immediate;   Fair Recent;   Fair Remote;   Poor  Judgement:  Fair  Insight:  Fair  Psychomotor Activity:  Normal  Concentration:  Concentration: limited and Attention Span: limited  Recall:  [employer] of Knowledge: Fair  Language: Fair  Akathisia:  No  Handed:  Left  AIMS (if indicated): does have essential tremors  Assets:  Manufacturing Systems Engineer Desire for Improvement Housing Social Support Transportation  ADL's:  Intact  Cognition: improving  Sleep:  varies - improving   Screenings: GAD-7    Loss Adjuster, Chartered Office Visit from 11/04/2024 in [facility] Office Visit from 09/02/2024 in [facility] Office Visit from 08/13/2024 in [facility]  Total GAD-7 Score 3 5 9    PHQ2-9    Flowsheet Row Office Visit from 11/04/2024 in [facility] Office Visit from 09/02/2024 in [facility] Office Visit from 08/13/2024 in [facility]  PHQ-2 Total Score 0 1 2  PHQ-9 Total Score -- 6 10   Flowsheet Row Office Visit from 09/02/2024 in [facility] Office Visit from 08/13/2024 in [facility] ED from 05/10/2024 in [facility] Emergency Department at [facility]  C-SSRS RISK  CATEGORY No Risk No Risk No Risk     Assessment and Plan: SYNIAH BERNE is a 73 year old Caucasian female who presented for a follow-up appointment, discussed assessment and plan as noted below.  1. PTSD (post-traumatic stress disorder)-improving Ongoing trauma related symptoms like intrusive memories although currently denies any frequent nightmares. Continue Buspirone  15 mg twice daily Continue Fluoxetine  60 mg daily Continue Rozerem  8 mg at bedtime for  sleep Encouraged to follow-up with psychotherapist for trauma focused therapy has upcoming appointment with Ms. Evalene Husband  2. Recurrent major depressive disorder, in full remission Currently denies any significant depression symptoms Continue Fluoxetine  60 mg daily  3. GAD (generalized anxiety disorder)-improving Currently reports anxiety symptoms is well-managed Continue Buspirone  as prescribed Continue Fluoxetine  60 mg daily Encouraged to continue CBT Continue Clonazepam  1 mg daily, plan to taper it off gradually.  4. Long-term current use of benzodiazepine History of long-term use of benzodiazepines currently off of the temazepam . Continue clonazepam  as prescribed for now, plan to taper it off gradually Currently also on Lyrica prescribed by neurologist. Patient aware of drug to drug interactions, CNS depressant effects.  5. Mild cognitive disorder-improving Currently under the care of neurologist at [facility].  Collateral information obtained from daughter who was present in session.  Follow-up Follow-up in clinic in 4 to 5 months or sooner if needed.    Collaboration of Care: Collaboration of Care: Referral or follow-up with counselor/therapist AEB encouraged to follow-up with therapist has upcoming appointment scheduled to establish care.  Patient/Guardian was advised Release of Information must be obtained prior to any record release in order to collaborate their care with an outside provider. Patient/Guardian was advised if they have not already done so to contact the registration department to sign all necessary forms in order for us  to release information regarding their care.   Consent: Patient/Guardian gives verbal consent for treatment and assignment of benefits for services provided during this visit. Patient/Guardian expressed understanding and agreed to proceed.   This note was generated in part or whole with voice recognition software. Voice recognition is  usually quite accurate but there are transcription errors that can and very often do occur. I apologize for any typographical errors that were not detected and corrected.    Jonpaul Lumm, MD 11/04/2024, 5:07 PM     [1]  Allergies Allergen Reactions   Bee Venom Anaphylaxis   Betadine [Povidone Iodine] Shortness Of Breath, Swelling and Rash   Doxycycline Dermatitis and Swelling    Reports lip swelling and rash   Flavoring Agent Hives and Itching   Hydrocodone  Itching and Shortness Of Breath   Iodides Shortness Of Breath, Swelling and Rash    Tongue swelling    Iodine Dermatitis, Shortness Of Breath and Swelling    Tongue swelling   Morphine And Codeine Anaphylaxis and Swelling   Oxycodone Anaphylaxis, Hives, Itching and Swelling    Lips became swollen   Povidone-Iodine Dermatitis, Shortness Of Breath and Swelling    Tongue swelling   Umeclidinium Swelling and Palpitations    Incruse Ellipta- Had tongue swelling and burning sensation in throat/tongue   Wellbutrin [Bupropion] Swelling    Became swollen all over (including tongue)   Codeine Swelling   Depakote [Divalproex Sodium] Hives and Nausea And Vomiting   Ibuprofen Other (See Comments)    irritates IBS ended up with an ulcer      Naproxen Swelling   Nsaids Other (See Comments)    Marked GI upset   Requip  [Ropinirole ] Other (See  Comments)    Caused body jolts, doctor stopped   Tape Swelling and Other (See Comments)    Swelling and redness at site applied    Topamax [Topiramate] Other (See Comments)    Lip swelling and tongue swelling    Amlodipine Dermatitis   Augmentin [Amoxicillin-Pot Clavulanate] Hives, Swelling and Rash   Clindamycin Dermatitis   Latex Hives, Itching, Swelling and Rash   Melatonin Itching and Rash    [employer] BRAND    Penicillins Hives, Itching, Swelling and Rash   Shellfish Allergy Diarrhea, Nausea And Vomiting, Swelling and Rash   Sulfa Antibiotics Hives, Itching and Swelling

## 2024-11-18 ENCOUNTER — Emergency Department

## 2024-11-18 ENCOUNTER — Ambulatory Visit: Admitting: Licensed Clinical Social Worker

## 2024-11-18 ENCOUNTER — Emergency Department: Admission: EM | Admit: 2024-11-18 | Discharge: 2024-11-19 | Disposition: A | Discharge status: Home / Self Care

## 2024-11-18 DIAGNOSIS — W009XXA Unspecified fall due to ice and snow, initial encounter: Secondary | ICD-10-CM | POA: Insufficient documentation

## 2024-11-18 DIAGNOSIS — M79604 Pain in right leg: Secondary | ICD-10-CM | POA: Diagnosis present

## 2024-11-18 DIAGNOSIS — S82401A Unspecified fracture of shaft of right fibula, initial encounter for closed fracture: Secondary | ICD-10-CM | POA: Insufficient documentation

## 2024-11-18 DIAGNOSIS — S92901A Unspecified fracture of right foot, initial encounter for closed fracture: Secondary | ICD-10-CM | POA: Diagnosis not present

## 2024-11-18 DIAGNOSIS — R519 Headache, unspecified: Secondary | ICD-10-CM | POA: Diagnosis not present

## 2024-11-18 DIAGNOSIS — W19XXXA Unspecified fall, initial encounter: Secondary | ICD-10-CM

## 2024-11-18 LAB — CBC WITH DIFFERENTIAL/PLATELET
Abs Immature Granulocytes: 0.02 10*3/uL (ref 0.00–0.07)
Basophils Absolute: 0.1 10*3/uL (ref 0.0–0.1)
Basophils Relative: 1 %
Eosinophils Absolute: 0.1 10*3/uL (ref 0.0–0.5)
Eosinophils Relative: 1 %
HCT: 37.9 % (ref 36.0–46.0)
Hemoglobin: 12.3 g/dL (ref 12.0–15.0)
Immature Granulocytes: 0 %
Lymphocytes Relative: 30 %
Lymphs Abs: 2.6 10*3/uL (ref 0.7–4.0)
MCH: 30.1 pg (ref 26.0–34.0)
MCHC: 32.5 g/dL (ref 30.0–36.0)
MCV: 92.9 fL (ref 80.0–100.0)
Monocytes Absolute: 0.8 10*3/uL (ref 0.1–1.0)
Monocytes Relative: 9 %
Neutro Abs: 5 10*3/uL (ref 1.7–7.7)
Neutrophils Relative %: 59 %
Platelets: 277 10*3/uL (ref 150–400)
RBC: 4.08 MIL/uL (ref 3.87–5.11)
RDW: 14.5 % (ref 11.5–15.5)
WBC: 8.5 10*3/uL (ref 4.0–10.5)
nRBC: 0 % (ref 0.0–0.2)

## 2024-11-18 LAB — BASIC METABOLIC PANEL WITH GFR
Anion gap: 10 (ref 5–15)
BUN: 12 mg/dL (ref 8–23)
CO2: 25 mmol/L (ref 22–32)
Calcium: 9 mg/dL (ref 8.9–10.3)
Chloride: 102 mmol/L (ref 98–111)
Creatinine, Ser: 0.96 mg/dL (ref 0.44–1.00)
GFR, Estimated: >60 mL/min
Glucose, Bld: 103 mg/dL — ABNORMAL HIGH (ref 70–99)
Potassium: 4.2 mmol/L (ref 3.5–5.1)
Sodium: 137 mmol/L (ref 135–145)

## 2024-11-18 MED ORDER — FENTANYL CITRATE (PF) 50 MCG/ML IJ SOSY
25.0000 ug | PREFILLED_SYRINGE | Freq: Once | INTRAMUSCULAR | Status: AC
  Administered 2024-11-18: 25 ug via INTRAVENOUS
  Filled 2024-11-18: qty 1

## 2024-11-18 MED ORDER — HYDROMORPHONE HCL 1 MG/ML IJ SOLN
1.0000 mg | Freq: Once | INTRAMUSCULAR | Status: AC
  Administered 2024-11-18: 1 mg via INTRAVENOUS
  Filled 2024-11-18: qty 1

## 2024-11-18 MED ORDER — ACETAMINOPHEN 500 MG PO TABS
1000.0000 mg | ORAL_TABLET | Freq: Once | ORAL | Status: AC
  Administered 2024-11-18: 1000 mg via ORAL
  Filled 2024-11-18: qty 2

## 2024-11-18 NOTE — ED Provider Notes (Signed)
. °  °  Dicky Anes, MD 11/19/24 [phone]

## 2024-11-18 NOTE — ED Notes (Addendum)
 Pt transported to CT ?

## 2024-11-18 NOTE — ED Triage Notes (Signed)
 Pt to ED via [facility] from home c/o a fall due to slipping on ice outside approx 1 hour ago. Pt endorsing 10/10 pain to right leg and back of the head. Denies taking a blood thinner. Recent fracture to right ankle. 100mcg fentanyl  given by EMS. Abrasions to bilateral hands.  150/57 74 HR 100% O2 room air

## 2024-11-18 NOTE — ED Provider Notes (Signed)
 "  [facility] Provider Note    Event Date/Time   First MD Initiated Contact with Patient 11/18/24 2037     (approximate)   History   Fall  Pt to ED via [facility] from home c/o a fall due to slipping on ice outside approx 1 hour ago. Pt endorsing 10/10 pain to right leg and back of the head. Denies taking a blood thinner. Recent fracture to right ankle. 100mcg fentanyl  given by EMS. Abrasions to bilateral hands.  150/57 74 HR 100% O2 room air   HPI [name] is a 74 y.o. female PMH chronic pain syndrome, Raynaud's, GAD, PTSD, IBS presents for evaluation of leg and head pain after fall - Patient had 2 falls on the ice today.  Most recently had a fall in a parking lot about an hour prior to my eval.  Slipped on ice.  Unsure exactly how she landed.  Did hit the back of her head, denies LOC.  Complains of right leg pain diffusely as well as some bilateral wrist pain in addition to headache. -Not on thinners -Does have issues with frequent falls and neuropathy for which she is followed by a neurologist.  Daughter at bedside provides collateral.  Has otherwise been in her usual state of health.  No recent infectious symptoms.  Daughter notes she does have a history of hyponatremia.     Physical Exam   Triage Vital Signs: ED Triage Vitals  Encounter Vitals Group     BP 11/18/24 2029 (!) 100/49     Girls Systolic BP Percentile --      Girls Diastolic BP Percentile --      Boys Systolic BP Percentile --      Boys Diastolic BP Percentile --      Pulse Rate 11/18/24 2029 75     Resp 11/18/24 2029 18     Temp 11/18/24 2029 98.1 F (36.7 C)     Temp Source 11/18/24 2029 Oral     SpO2 11/18/24 2029 99 %     Weight 11/18/24 2030 241 lb (109.3 kg)     Height 11/18/24 2030 5' 5 (1.651 m)     Head Circumference --      Peak Flow --      Pain Score 11/18/24 2030 9     Pain Loc --      Pain Education --      Exclude from Growth Chart --     Most recent vital  signs: Vitals:   11/18/24 2029  BP: (!) 100/49  Pulse: 75  Resp: 18  Temp: 98.1 F (36.7 C)  SpO2: 99%     General: Awake, no distress.  HEENT: Small hematoma to occiput, no other traumatic findings.  No midline neck pain or step-off. CV:  Good peripheral perfusion. RRR, RP 2+ Resp:  Normal effort. CTAB Abd:  No distention. Nontender to deep palpation throughout Other:  Tender to palpation bilateral wrists with very mild superficial abrasions.  Does have notable tenderness over foot and ankle on the right lower extremity with no appreciable swelling, no gross deformity, no skin tenting, no open wounds.  Also tenderness over knee and hip.  No significant tenderness of the thigh.  No wounds appreciated.  Pedal pulse 2+.     ED Results / Procedures / Treatments   Labs (all labs ordered are listed, but only abnormal results are displayed) Labs Reviewed - No data to display   EKG  N/a  RADIOLOGY  Radiology interpreted myself radiology reports reviewed.  Notable for proximal fibular shaft fracture first digit phalanx fracture and possible second digit phalanx fracture.   PROCEDURES:  Critical Care performed: No  Procedures   MEDICATIONS ORDERED IN ED: Medications - No data to display   IMPRESSION / MDM / ASSESSMENT AND PLAN / ED COURSE  I reviewed the triage vital signs and the nursing notes.                              DDX/MDM/AP: Differential diagnosis includes, but is not limited to, mechanical fall although given history of hyponatremia will get screening labs in shared decision making with family.  Doubt underlying or other organic etiology of fall.  Consider foot, ankle fracture, knee fracture, hip fracture, wrist fractures, intracranial hemorrhage, skull fracture, C-spine injury.  Plan: - Further pain control - X-rays of right lower extremity, bilateral wrists - CT head, C-spine - N.p.o.  Patient's presentation is most consistent with acute  presentation with potential threat to life or bodily function.   ED course below.  Imaging with fibular shaft fracture as well as first and possibly second digit nondisplaced phalanx fractures.  Placed in cam boot.  Already has a walker.  No other traumatic injuries identified.  Clinical Course as of 11/18/24 2343  Tue Nov 18, 2024  2159 CTH: IMPRESSION: 1. No acute intracranial abnormality. 2. Partial opacification of left mastoid air cells.   [MM]  2159 CTCspine: IMPRESSION: 1. No evidence of acute traumatic injury.   [MM]  2241 XR R foot: IMPRESSION: 1. Acute intra-articular nondisplaced first digit distal phalanx base fracture. 2. Possible acute nondisplaced second digit proximal phalanx base fracture.   [MM]  2241 XR tib fib: IMPRESSION: 1. Mildly comminuted nondisplaced proximal fibular shaft fracture. 2. Trace joint effusion and degenerative changes of the knee.   [MM]  2341 Cbc wnl [MM]    Clinical Course User Index [MM] Clarine Ozell LABOR, MD     FINAL CLINICAL IMPRESSION(S) / ED DIAGNOSES   Final diagnoses:  None     Rx / DC Orders   ED Discharge Orders     None        Note:  This document was prepared using Dragon voice recognition software and may include unintentional dictation errors. "

## 2024-11-19 MED ORDER — ACETAMINOPHEN 500 MG PO TABS: 1000.0000 mg | ORAL_TABLET | Freq: Four times a day (QID) | ORAL | 2 refills | Status: AC | PRN

## 2024-11-19 MED ORDER — ONDANSETRON 4 MG PO TBDP: 4.0000 mg | ORAL_TABLET | Freq: Three times a day (TID) | ORAL | 0 refills | Status: AC | PRN

## 2024-11-19 MED ORDER — ONDANSETRON HCL 4 MG/2ML IJ SOLN
4.0000 mg | Freq: Once | INTRAMUSCULAR | Status: AC
  Administered 2024-11-19: 4 mg via INTRAVENOUS
  Filled 2024-11-19: qty 2

## 2024-11-19 MED ORDER — HYDROMORPHONE HCL 1 MG/ML IJ SOLN
0.5000 mg | Freq: Once | INTRAMUSCULAR | Status: AC
  Administered 2024-11-19: 0.5 mg via INTRAVENOUS
  Filled 2024-11-19: qty 0.5

## 2024-11-19 MED ORDER — HYDROMORPHONE HCL 2 MG PO TABS: 1.0000 mg | ORAL_TABLET | Freq: Two times a day (BID) | ORAL | 0 refills | Status: AC | PRN

## 2024-11-19 NOTE — Discharge Instructions (Addendum)
 Your evaluation in the emergency department was notable for 2 fractures in your as well as a fracture of the fibula bone in your shin.  I provided you the number for the on-call podiatrist--please call them to schedule an appointment.  You can also follow-up with your orthopedic doctor regarding your shin bone fracture in addition.  I prescribed you pain medications to use as needed for any ongoing discomfort as well as a nausea medication.  Return to the emergency department with any new or worsening symptoms.

## 2024-11-19 NOTE — ED Notes (Signed)
 Pt verbalizes understanding of discharge instructions. Opportunity for questioning and answers were provided. Pt discharged from ED with family.

## 2024-11-25 ENCOUNTER — Ambulatory Visit: Admitting: Licensed Clinical Social Worker

## 2025-04-07 ENCOUNTER — Ambulatory Visit: Admitting: Psychiatry

## 2025-07-28 ENCOUNTER — Encounter: Admitting: Dermatology
